# Patient Record
Sex: Female | Born: 1935 | Race: Black or African American | Hispanic: No | Marital: Married | Smoking: Former smoker
Health system: Southern US, Community
[De-identification: ages and names within clinical notes are randomized; demographics above are authoritative.]

## PROBLEM LIST (undated history)

## (undated) DIAGNOSIS — D649 Anemia, unspecified: Secondary | ICD-10-CM

## (undated) DIAGNOSIS — E669 Obesity, unspecified: Secondary | ICD-10-CM

## (undated) DIAGNOSIS — T7840XA Allergy, unspecified, initial encounter: Secondary | ICD-10-CM

## (undated) DIAGNOSIS — G4730 Sleep apnea, unspecified: Secondary | ICD-10-CM

## (undated) DIAGNOSIS — E785 Hyperlipidemia, unspecified: Secondary | ICD-10-CM

## (undated) DIAGNOSIS — M549 Dorsalgia, unspecified: Secondary | ICD-10-CM

## (undated) DIAGNOSIS — M1990 Unspecified osteoarthritis, unspecified site: Secondary | ICD-10-CM

## (undated) DIAGNOSIS — I5189 Other ill-defined heart diseases: Secondary | ICD-10-CM

## (undated) DIAGNOSIS — N189 Chronic kidney disease, unspecified: Secondary | ICD-10-CM

## (undated) DIAGNOSIS — E109 Type 1 diabetes mellitus without complications: Secondary | ICD-10-CM

## (undated) DIAGNOSIS — I10 Essential (primary) hypertension: Secondary | ICD-10-CM

## (undated) HISTORY — PX: OTHER SURGICAL HISTORY: SHX169

## (undated) HISTORY — DX: Unspecified osteoarthritis, unspecified site: M19.90

## (undated) HISTORY — DX: Obesity, unspecified: E66.9

## (undated) HISTORY — DX: Other ill-defined heart diseases: I51.89

## (undated) HISTORY — DX: Sleep apnea, unspecified: G47.30

## (undated) HISTORY — DX: Anemia, unspecified: D64.9

## (undated) HISTORY — DX: Chronic kidney disease, unspecified: N18.9

## (undated) HISTORY — DX: Essential (primary) hypertension: I10

## (undated) HISTORY — DX: Allergy, unspecified, initial encounter: T78.40XA

## (undated) HISTORY — DX: Hyperlipidemia, unspecified: E78.5

## (undated) HISTORY — DX: Dorsalgia, unspecified: M54.9

## (undated) HISTORY — DX: Type 1 diabetes mellitus without complications: E10.9

---

## 1974-03-24 HISTORY — PX: OTHER SURGICAL HISTORY: SHX169

## 1976-03-24 HISTORY — PX: MYOMECTOMY: SHX85

## 1976-03-24 HISTORY — PX: VAGINAL HYSTERECTOMY: SHX2639

## 2002-08-09 ENCOUNTER — Encounter: Payer: Self-pay | Admitting: Emergency Medicine

## 2002-08-09 ENCOUNTER — Emergency Department (HOSPITAL_COMMUNITY): Admission: EM | Admit: 2002-08-09 | Discharge: 2002-08-09 | Payer: Self-pay | Admitting: Emergency Medicine

## 2003-02-02 ENCOUNTER — Ambulatory Visit (HOSPITAL_COMMUNITY): Admission: RE | Admit: 2003-02-02 | Discharge: 2003-02-02 | Payer: Self-pay | Admitting: Family Medicine

## 2003-04-05 ENCOUNTER — Inpatient Hospital Stay (HOSPITAL_COMMUNITY): Admission: RE | Admit: 2003-04-05 | Discharge: 2003-04-09 | Payer: Self-pay | Admitting: Orthopedic Surgery

## 2003-07-28 ENCOUNTER — Emergency Department (HOSPITAL_COMMUNITY): Admission: EM | Admit: 2003-07-28 | Discharge: 2003-07-29 | Payer: Self-pay | Admitting: *Deleted

## 2004-01-25 ENCOUNTER — Ambulatory Visit: Payer: Self-pay | Admitting: Family Medicine

## 2004-02-21 ENCOUNTER — Ambulatory Visit (HOSPITAL_COMMUNITY): Admission: RE | Admit: 2004-02-21 | Discharge: 2004-02-21 | Payer: Self-pay | Admitting: Family Medicine

## 2004-05-01 ENCOUNTER — Ambulatory Visit: Payer: Self-pay | Admitting: Family Medicine

## 2004-07-02 ENCOUNTER — Ambulatory Visit: Payer: Self-pay | Admitting: Family Medicine

## 2004-07-03 ENCOUNTER — Ambulatory Visit (HOSPITAL_COMMUNITY): Admission: RE | Admit: 2004-07-03 | Discharge: 2004-07-03 | Payer: Self-pay | Admitting: Family Medicine

## 2004-10-16 ENCOUNTER — Ambulatory Visit: Payer: Self-pay | Admitting: Family Medicine

## 2004-12-30 ENCOUNTER — Ambulatory Visit: Payer: Self-pay | Admitting: Family Medicine

## 2005-02-12 ENCOUNTER — Ambulatory Visit: Payer: Self-pay | Admitting: Family Medicine

## 2005-02-14 ENCOUNTER — Encounter (INDEPENDENT_AMBULATORY_CARE_PROVIDER_SITE_OTHER): Payer: Self-pay | Admitting: *Deleted

## 2005-02-27 ENCOUNTER — Ambulatory Visit (HOSPITAL_COMMUNITY): Admission: RE | Admit: 2005-02-27 | Discharge: 2005-02-27 | Payer: Self-pay | Admitting: Family Medicine

## 2005-05-19 ENCOUNTER — Ambulatory Visit: Payer: Self-pay | Admitting: Family Medicine

## 2005-06-03 ENCOUNTER — Ambulatory Visit (HOSPITAL_COMMUNITY): Admission: RE | Admit: 2005-06-03 | Discharge: 2005-06-03 | Payer: Self-pay | Admitting: Family Medicine

## 2005-11-05 ENCOUNTER — Ambulatory Visit: Payer: Self-pay | Admitting: Family Medicine

## 2005-12-24 ENCOUNTER — Ambulatory Visit: Payer: Self-pay | Admitting: Family Medicine

## 2005-12-31 ENCOUNTER — Ambulatory Visit (HOSPITAL_COMMUNITY): Admission: RE | Admit: 2005-12-31 | Discharge: 2005-12-31 | Payer: Self-pay | Admitting: Family Medicine

## 2006-01-28 ENCOUNTER — Encounter: Admission: RE | Admit: 2006-01-28 | Discharge: 2006-01-28 | Payer: Self-pay | Admitting: Family Medicine

## 2006-04-01 ENCOUNTER — Encounter: Admission: RE | Admit: 2006-04-01 | Discharge: 2006-04-01 | Payer: Self-pay | Admitting: Family Medicine

## 2006-04-22 ENCOUNTER — Encounter: Admission: RE | Admit: 2006-04-22 | Discharge: 2006-04-22 | Payer: Self-pay | Admitting: Family Medicine

## 2006-05-20 ENCOUNTER — Ambulatory Visit: Payer: Self-pay | Admitting: Family Medicine

## 2006-05-21 ENCOUNTER — Encounter: Payer: Self-pay | Admitting: Family Medicine

## 2006-05-28 ENCOUNTER — Encounter: Payer: Self-pay | Admitting: Family Medicine

## 2006-05-28 LAB — CONVERTED CEMR LAB
Alkaline Phosphatase: 97 units/L (ref 39–117)
BUN: 22 mg/dL (ref 6–23)
Basophils Relative: 1 % (ref 0–1)
Bilirubin, Direct: 0.1 mg/dL (ref 0.0–0.3)
Chloride: 110 meq/L (ref 96–112)
Cholesterol: 133 mg/dL (ref 0–200)
Creatinine, Ser: 1.39 mg/dL — ABNORMAL HIGH (ref 0.40–1.20)
Eosinophils Absolute: 0.3 10*3/uL (ref 0.0–0.7)
Glucose, Bld: 87 mg/dL (ref 70–99)
Hemoglobin: 10.2 g/dL — ABNORMAL LOW (ref 12.0–15.0)
Hgb A1c MFr Bld: 6.9 % — ABNORMAL HIGH (ref 4.6–6.1)
Indirect Bilirubin: 0.2 mg/dL (ref 0.0–0.9)
LDL Cholesterol: 65 mg/dL (ref 0–99)
Lymphs Abs: 1.6 10*3/uL (ref 0.7–3.3)
MCHC: 30.6 g/dL (ref 30.0–36.0)
MCV: 83.3 fL (ref 78.0–100.0)
Monocytes Absolute: 1 10*3/uL — ABNORMAL HIGH (ref 0.2–0.7)
Monocytes Relative: 13 % — ABNORMAL HIGH (ref 3–11)
RBC: 4 M/uL (ref 3.87–5.11)
Total Protein: 7.1 g/dL (ref 6.0–8.3)
Triglycerides: 78 mg/dL (ref <150)

## 2006-06-03 ENCOUNTER — Ambulatory Visit: Payer: Self-pay | Admitting: Family Medicine

## 2006-06-30 ENCOUNTER — Ambulatory Visit: Payer: Self-pay | Admitting: Family Medicine

## 2006-08-13 ENCOUNTER — Ambulatory Visit: Payer: Self-pay | Admitting: Family Medicine

## 2006-09-23 ENCOUNTER — Ambulatory Visit: Payer: Self-pay | Admitting: Family Medicine

## 2006-09-30 ENCOUNTER — Ambulatory Visit (HOSPITAL_COMMUNITY): Admission: RE | Admit: 2006-09-30 | Discharge: 2006-09-30 | Payer: Self-pay | Admitting: Family Medicine

## 2006-12-09 ENCOUNTER — Ambulatory Visit: Payer: Self-pay | Admitting: Family Medicine

## 2007-03-03 ENCOUNTER — Ambulatory Visit: Payer: Self-pay | Admitting: Family Medicine

## 2007-03-25 ENCOUNTER — Encounter: Payer: Self-pay | Admitting: Family Medicine

## 2007-03-25 DIAGNOSIS — G4730 Sleep apnea, unspecified: Secondary | ICD-10-CM

## 2007-03-25 HISTORY — DX: Sleep apnea, unspecified: G47.30

## 2007-04-03 ENCOUNTER — Encounter: Payer: Self-pay | Admitting: Family Medicine

## 2007-04-03 LAB — CONVERTED CEMR LAB
ALT: 10 units/L (ref 0–35)
AST: 12 units/L (ref 0–37)
BUN: 27 mg/dL — ABNORMAL HIGH (ref 6–23)
Bilirubin, Direct: 0.1 mg/dL (ref 0.0–0.3)
CO2: 22 meq/L (ref 19–32)
Calcium: 9.2 mg/dL (ref 8.4–10.5)
Cholesterol: 134 mg/dL (ref 0–200)
Creatinine, Ser: 1.57 mg/dL — ABNORMAL HIGH (ref 0.40–1.20)
Eosinophils Absolute: 0.4 10*3/uL (ref 0.0–0.7)
Eosinophils Relative: 5 % (ref 0–5)
Glucose, Bld: 114 mg/dL — ABNORMAL HIGH (ref 70–99)
HCT: 33.2 % — ABNORMAL LOW (ref 36.0–46.0)
Hemoglobin: 10.1 g/dL — ABNORMAL LOW (ref 12.0–15.0)
Indirect Bilirubin: 0.1 mg/dL (ref 0.0–0.9)
Lymphocytes Relative: 34 % (ref 12–46)
Lymphs Abs: 3 10*3/uL (ref 0.7–4.0)
MCV: 80.6 fL (ref 78.0–100.0)
Microalb, Ur: 30.2 mg/dL — ABNORMAL HIGH (ref 0.00–1.89)
Monocytes Absolute: 0.8 10*3/uL (ref 0.1–1.0)
Monocytes Relative: 8 % (ref 3–12)
Platelets: 313 10*3/uL (ref 150–400)
Total Bilirubin: 0.2 mg/dL — ABNORMAL LOW (ref 0.3–1.2)
Total CHOL/HDL Ratio: 3.6
WBC: 8.9 10*3/uL (ref 4.0–10.5)

## 2007-04-21 ENCOUNTER — Other Ambulatory Visit: Admission: RE | Admit: 2007-04-21 | Discharge: 2007-04-21 | Payer: Self-pay | Admitting: Family Medicine

## 2007-04-21 ENCOUNTER — Encounter (INDEPENDENT_AMBULATORY_CARE_PROVIDER_SITE_OTHER): Payer: Self-pay | Admitting: *Deleted

## 2007-04-21 ENCOUNTER — Ambulatory Visit: Payer: Self-pay | Admitting: Family Medicine

## 2007-04-21 ENCOUNTER — Encounter: Payer: Self-pay | Admitting: Family Medicine

## 2007-05-18 ENCOUNTER — Ambulatory Visit: Payer: Self-pay | Admitting: Family Medicine

## 2007-06-23 ENCOUNTER — Ambulatory Visit: Payer: Self-pay | Admitting: Family Medicine

## 2007-07-28 ENCOUNTER — Ambulatory Visit: Payer: Self-pay | Admitting: Family Medicine

## 2007-08-04 ENCOUNTER — Encounter (INDEPENDENT_AMBULATORY_CARE_PROVIDER_SITE_OTHER): Payer: Self-pay | Admitting: *Deleted

## 2007-08-04 DIAGNOSIS — M549 Dorsalgia, unspecified: Secondary | ICD-10-CM | POA: Insufficient documentation

## 2007-08-04 DIAGNOSIS — E669 Obesity, unspecified: Secondary | ICD-10-CM

## 2007-08-04 DIAGNOSIS — E109 Type 1 diabetes mellitus without complications: Secondary | ICD-10-CM | POA: Insufficient documentation

## 2007-08-04 DIAGNOSIS — I10 Essential (primary) hypertension: Secondary | ICD-10-CM | POA: Insufficient documentation

## 2007-08-04 DIAGNOSIS — E785 Hyperlipidemia, unspecified: Secondary | ICD-10-CM

## 2007-10-06 ENCOUNTER — Ambulatory Visit: Payer: Self-pay | Admitting: Family Medicine

## 2007-10-25 ENCOUNTER — Telehealth: Payer: Self-pay | Admitting: Family Medicine

## 2007-11-16 ENCOUNTER — Ambulatory Visit: Payer: Self-pay | Admitting: Family Medicine

## 2007-11-16 DIAGNOSIS — M25559 Pain in unspecified hip: Secondary | ICD-10-CM

## 2007-11-16 DIAGNOSIS — R7302 Impaired glucose tolerance (oral): Secondary | ICD-10-CM | POA: Insufficient documentation

## 2007-11-16 LAB — CONVERTED CEMR LAB
Glucose, Bld: 140 mg/dL
Hgb A1c MFr Bld: 6.7 %

## 2007-11-17 ENCOUNTER — Telehealth: Payer: Self-pay | Admitting: Family Medicine

## 2007-11-25 ENCOUNTER — Encounter: Payer: Self-pay | Admitting: Family Medicine

## 2007-11-26 ENCOUNTER — Ambulatory Visit (HOSPITAL_COMMUNITY): Admission: RE | Admit: 2007-11-26 | Discharge: 2007-11-26 | Payer: Self-pay | Admitting: Family Medicine

## 2007-12-01 ENCOUNTER — Telehealth: Payer: Self-pay | Admitting: Family Medicine

## 2007-12-03 ENCOUNTER — Encounter: Payer: Self-pay | Admitting: Family Medicine

## 2007-12-17 ENCOUNTER — Encounter (HOSPITAL_COMMUNITY): Admission: RE | Admit: 2007-12-17 | Discharge: 2007-12-22 | Payer: Self-pay | Admitting: Unknown Physician Specialty

## 2007-12-22 ENCOUNTER — Ambulatory Visit: Payer: Self-pay | Admitting: Family Medicine

## 2007-12-24 ENCOUNTER — Encounter (HOSPITAL_COMMUNITY): Admission: RE | Admit: 2007-12-24 | Discharge: 2008-01-23 | Payer: Self-pay | Admitting: Unknown Physician Specialty

## 2008-01-04 ENCOUNTER — Encounter: Payer: Self-pay | Admitting: Family Medicine

## 2008-01-19 ENCOUNTER — Ambulatory Visit: Payer: Self-pay | Admitting: Family Medicine

## 2008-02-01 ENCOUNTER — Encounter: Payer: Self-pay | Admitting: Family Medicine

## 2008-02-23 ENCOUNTER — Ambulatory Visit: Payer: Self-pay | Admitting: Family Medicine

## 2008-02-23 DIAGNOSIS — R5381 Other malaise: Secondary | ICD-10-CM

## 2008-02-23 DIAGNOSIS — R5383 Other fatigue: Secondary | ICD-10-CM

## 2008-02-23 LAB — CONVERTED CEMR LAB: Glucose, Bld: 128 mg/dL

## 2008-02-24 ENCOUNTER — Encounter: Payer: Self-pay | Admitting: Family Medicine

## 2008-03-02 ENCOUNTER — Encounter: Payer: Self-pay | Admitting: Family Medicine

## 2008-04-26 ENCOUNTER — Telehealth: Payer: Self-pay | Admitting: Family Medicine

## 2008-04-26 ENCOUNTER — Ambulatory Visit: Payer: Self-pay | Admitting: Family Medicine

## 2008-05-08 ENCOUNTER — Encounter: Payer: Self-pay | Admitting: Family Medicine

## 2008-05-17 ENCOUNTER — Ambulatory Visit: Payer: Self-pay | Admitting: Family Medicine

## 2008-06-01 ENCOUNTER — Encounter: Payer: Self-pay | Admitting: Family Medicine

## 2008-07-06 ENCOUNTER — Encounter: Payer: Self-pay | Admitting: Family Medicine

## 2008-07-31 ENCOUNTER — Ambulatory Visit: Payer: Self-pay | Admitting: Family Medicine

## 2008-07-31 DIAGNOSIS — M170 Bilateral primary osteoarthritis of knee: Secondary | ICD-10-CM

## 2008-07-31 LAB — CONVERTED CEMR LAB
Glucose, Bld: 115 mg/dL
Hgb A1c MFr Bld: 7.2 %

## 2008-08-01 DIAGNOSIS — R946 Abnormal results of thyroid function studies: Secondary | ICD-10-CM

## 2008-08-01 LAB — CONVERTED CEMR LAB
BUN: 20 mg/dL (ref 6–23)
Basophils Absolute: 0.1 10*3/uL (ref 0.0–0.1)
Basophils Relative: 1 % (ref 0–1)
Calcium: 9.5 mg/dL (ref 8.4–10.5)
Eosinophils Relative: 3 % (ref 0–5)
Glucose, Bld: 86 mg/dL (ref 70–99)
HCT: 29.8 % — ABNORMAL LOW (ref 36.0–46.0)
Hemoglobin: 9.4 g/dL — ABNORMAL LOW (ref 12.0–15.0)
MCHC: 31.5 g/dL (ref 30.0–36.0)
Monocytes Absolute: 0.9 10*3/uL (ref 0.1–1.0)
Neutro Abs: 4.9 10*3/uL (ref 1.7–7.7)
Platelets: 353 10*3/uL (ref 150–400)
RDW: 17.5 % — ABNORMAL HIGH (ref 11.5–15.5)
TSH: 12.974 microintl units/mL — ABNORMAL HIGH (ref 0.350–4.500)

## 2008-08-03 ENCOUNTER — Ambulatory Visit (HOSPITAL_COMMUNITY): Admission: RE | Admit: 2008-08-03 | Discharge: 2008-08-03 | Payer: Self-pay | Admitting: Family Medicine

## 2008-10-02 ENCOUNTER — Encounter: Payer: Self-pay | Admitting: Family Medicine

## 2008-10-03 ENCOUNTER — Telehealth: Payer: Self-pay | Admitting: Family Medicine

## 2008-10-03 ENCOUNTER — Ambulatory Visit: Payer: Self-pay | Admitting: Family Medicine

## 2008-11-24 ENCOUNTER — Telehealth: Payer: Self-pay | Admitting: Family Medicine

## 2008-11-28 ENCOUNTER — Encounter: Payer: Self-pay | Admitting: Family Medicine

## 2008-12-01 ENCOUNTER — Encounter: Payer: Self-pay | Admitting: Family Medicine

## 2008-12-26 ENCOUNTER — Ambulatory Visit: Payer: Self-pay | Admitting: Family Medicine

## 2009-01-22 ENCOUNTER — Encounter: Payer: Self-pay | Admitting: Family Medicine

## 2009-02-05 ENCOUNTER — Encounter: Payer: Self-pay | Admitting: Family Medicine

## 2009-02-26 ENCOUNTER — Telehealth: Payer: Self-pay | Admitting: Family Medicine

## 2009-03-01 ENCOUNTER — Ambulatory Visit: Payer: Self-pay | Admitting: Family Medicine

## 2009-03-01 LAB — CONVERTED CEMR LAB: Hgb A1c MFr Bld: 6.4 %

## 2009-03-02 LAB — CONVERTED CEMR LAB
ALT: 9 units/L (ref 0–35)
AST: 16 units/L (ref 0–37)
Alkaline Phosphatase: 118 units/L — ABNORMAL HIGH (ref 39–117)
Basophils Absolute: 0.1 10*3/uL (ref 0.0–0.1)
Basophils Relative: 1 % (ref 0–1)
Bilirubin, Direct: <0.1 mg/dL (ref 0.0–0.3)
CO2: 16 meq/L — ABNORMAL LOW (ref 19–32)
Calcium: 9.6 mg/dL (ref 8.4–10.5)
Cholesterol: 144 mg/dL (ref 0–200)
Creatinine, Ser: 1.74 mg/dL — ABNORMAL HIGH (ref 0.40–1.20)
Eosinophils Absolute: 0.4 10*3/uL (ref 0.0–0.7)
Glucose, Bld: 88 mg/dL (ref 70–99)
MCHC: 32.1 g/dL (ref 30.0–36.0)
MCV: 78.8 fL (ref 78.0–100.0)
Monocytes Relative: 9 % (ref 3–12)
Neutro Abs: 4.4 10*3/uL (ref 1.7–7.7)
Neutrophils Relative %: 46 % (ref 43–77)
RBC: 4.15 M/uL (ref 3.87–5.11)
RDW: 17.2 % — ABNORMAL HIGH (ref 11.5–15.5)
Total Bilirubin: 0.2 mg/dL — ABNORMAL LOW (ref 0.3–1.2)

## 2009-03-05 LAB — CONVERTED CEMR LAB: Microalb Creat Ratio: 509.6 mg/g — ABNORMAL HIGH (ref 0.0–30.0)

## 2009-03-13 ENCOUNTER — Telehealth: Payer: Self-pay | Admitting: Family Medicine

## 2009-03-20 ENCOUNTER — Telehealth: Payer: Self-pay | Admitting: Family Medicine

## 2009-03-29 ENCOUNTER — Ambulatory Visit (HOSPITAL_COMMUNITY): Admission: RE | Admit: 2009-03-29 | Discharge: 2009-03-29 | Payer: Self-pay | Admitting: Family Medicine

## 2009-03-30 ENCOUNTER — Encounter: Payer: Self-pay | Admitting: Family Medicine

## 2009-04-04 ENCOUNTER — Encounter: Payer: Self-pay | Admitting: Family Medicine

## 2009-04-05 ENCOUNTER — Telehealth: Payer: Self-pay | Admitting: Family Medicine

## 2009-05-25 ENCOUNTER — Encounter: Payer: Self-pay | Admitting: Family Medicine

## 2009-07-05 ENCOUNTER — Ambulatory Visit: Payer: Self-pay | Admitting: Family Medicine

## 2009-07-05 LAB — CONVERTED CEMR LAB: Blood Glucose, Fasting: 108 mg/dL

## 2009-07-06 ENCOUNTER — Telehealth: Payer: Self-pay | Admitting: Family Medicine

## 2009-07-10 ENCOUNTER — Ambulatory Visit (HOSPITAL_COMMUNITY): Admission: RE | Admit: 2009-07-10 | Discharge: 2009-07-10 | Payer: Self-pay | Admitting: Family Medicine

## 2009-07-10 LAB — CONVERTED CEMR LAB
Albumin: 4.5 g/dL (ref 3.5–5.2)
Alkaline Phosphatase: 117 units/L (ref 39–117)
Chloride: 106 meq/L (ref 96–112)
HDL: 40 mg/dL (ref >39)
Hgb A1c MFr Bld: 6.6 % — ABNORMAL HIGH (ref <5.7)
LDL Cholesterol: 57 mg/dL (ref 0–99)
Potassium: 3.9 meq/L (ref 3.5–5.3)
Sodium: 139 meq/L (ref 135–145)
Total CHOL/HDL Ratio: 4
Total Protein: 7.8 g/dL (ref 6.0–8.3)
Triglycerides: 312 mg/dL — ABNORMAL HIGH (ref <150)
VLDL: 62 mg/dL — ABNORMAL HIGH (ref 0–40)
Vit D, 25-Hydroxy: 10 ng/mL — ABNORMAL LOW (ref 30–89)

## 2009-07-20 ENCOUNTER — Encounter: Payer: Self-pay | Admitting: Family Medicine

## 2009-08-17 ENCOUNTER — Encounter: Payer: Self-pay | Admitting: Family Medicine

## 2009-10-01 ENCOUNTER — Telehealth: Payer: Self-pay | Admitting: Family Medicine

## 2009-10-02 ENCOUNTER — Ambulatory Visit: Payer: Self-pay | Admitting: Family Medicine

## 2009-11-13 ENCOUNTER — Ambulatory Visit: Payer: Self-pay | Admitting: Family Medicine

## 2009-11-13 DIAGNOSIS — M949 Disorder of cartilage, unspecified: Secondary | ICD-10-CM

## 2009-11-13 DIAGNOSIS — N189 Chronic kidney disease, unspecified: Secondary | ICD-10-CM

## 2009-11-13 DIAGNOSIS — M899 Disorder of bone, unspecified: Secondary | ICD-10-CM | POA: Insufficient documentation

## 2009-11-19 ENCOUNTER — Encounter: Payer: Self-pay | Admitting: Family Medicine

## 2009-11-19 LAB — CONVERTED CEMR LAB
Eosinophils Absolute: 0.2 10*3/uL (ref 0.0–0.7)
Hgb A1c MFr Bld: 6.8 % — ABNORMAL HIGH (ref <5.7)
Iron: 49 ug/dL (ref 42–145)
Lymphs Abs: 2.8 10*3/uL (ref 0.7–4.0)
MCV: 81.6 fL (ref 78.0–100.0)
Neutrophils Relative %: 58 % (ref 43–77)
Platelets: 341 10*3/uL (ref 150–400)
TSH: 6.848 microintl units/mL — ABNORMAL HIGH (ref 0.350–4.500)
WBC: 8.9 10*3/uL (ref 4.0–10.5)

## 2009-11-20 ENCOUNTER — Encounter: Payer: Self-pay | Admitting: Family Medicine

## 2009-11-29 ENCOUNTER — Ambulatory Visit: Payer: Self-pay | Admitting: Family Medicine

## 2010-01-22 DIAGNOSIS — I5189 Other ill-defined heart diseases: Secondary | ICD-10-CM

## 2010-01-22 HISTORY — DX: Other ill-defined heart diseases: I51.89

## 2010-02-04 ENCOUNTER — Telehealth: Payer: Self-pay | Admitting: Family Medicine

## 2010-02-04 ENCOUNTER — Encounter: Payer: Self-pay | Admitting: Family Medicine

## 2010-02-04 ENCOUNTER — Observation Stay (HOSPITAL_COMMUNITY)
Admission: EM | Admit: 2010-02-04 | Discharge: 2010-02-06 | Payer: Self-pay | Source: Home / Self Care | Admitting: Emergency Medicine

## 2010-02-05 ENCOUNTER — Encounter (INDEPENDENT_AMBULATORY_CARE_PROVIDER_SITE_OTHER): Payer: Self-pay | Admitting: Internal Medicine

## 2010-02-05 ENCOUNTER — Telehealth: Payer: Self-pay | Admitting: Family Medicine

## 2010-02-06 ENCOUNTER — Encounter: Payer: Self-pay | Admitting: Family Medicine

## 2010-02-12 ENCOUNTER — Telehealth: Payer: Self-pay | Admitting: Family Medicine

## 2010-02-12 ENCOUNTER — Ambulatory Visit: Payer: Self-pay | Admitting: Family Medicine

## 2010-02-12 DIAGNOSIS — N186 End stage renal disease: Secondary | ICD-10-CM | POA: Insufficient documentation

## 2010-02-13 ENCOUNTER — Telehealth: Payer: Self-pay | Admitting: Family Medicine

## 2010-02-13 ENCOUNTER — Encounter: Payer: Self-pay | Admitting: Family Medicine

## 2010-02-18 ENCOUNTER — Encounter: Payer: Self-pay | Admitting: Family Medicine

## 2010-02-25 ENCOUNTER — Telehealth: Payer: Self-pay | Admitting: Family Medicine

## 2010-03-01 ENCOUNTER — Encounter: Payer: Self-pay | Admitting: Family Medicine

## 2010-03-04 ENCOUNTER — Telehealth: Payer: Self-pay | Admitting: Family Medicine

## 2010-03-04 ENCOUNTER — Encounter: Payer: Self-pay | Admitting: Family Medicine

## 2010-03-05 ENCOUNTER — Telehealth: Payer: Self-pay | Admitting: Family Medicine

## 2010-03-11 ENCOUNTER — Encounter: Payer: Self-pay | Admitting: Family Medicine

## 2010-03-11 ENCOUNTER — Telehealth: Payer: Self-pay | Admitting: Family Medicine

## 2010-03-12 ENCOUNTER — Telehealth: Payer: Self-pay | Admitting: Family Medicine

## 2010-03-13 ENCOUNTER — Encounter: Payer: Self-pay | Admitting: Family Medicine

## 2010-03-14 ENCOUNTER — Ambulatory Visit: Payer: Self-pay | Admitting: Family Medicine

## 2010-04-14 ENCOUNTER — Encounter: Payer: Self-pay | Admitting: Family Medicine

## 2010-04-15 ENCOUNTER — Encounter: Payer: Self-pay | Admitting: Family Medicine

## 2010-04-19 ENCOUNTER — Encounter: Payer: Self-pay | Admitting: Family Medicine

## 2010-04-23 NOTE — Assessment & Plan Note (Signed)
Summary: INJECTION  Nurse Visit   Allergies: 1)  ! Ace Inhibitors 2)  ! * Arb  Medication Administration  Injection # 1:    Medication: Depo- Medrol 80mg     Diagnosis: KNEE PAIN, BILATERAL (ICD-719.46)    Route: IM    Site: LUOQ gluteus    Exp Date: 4/12    Lot #: [id number]    Mfr: Pharmacia    Patient tolerated injection without complications    Given by: Adella Hare LPN (October 02, 2009 4:33 PM)  Orders Added: 1)  Depo- Medrol 80mg  [J1040] 2)  Admin of Therapeutic Inj  intramuscular or subcutaneous [[id number]]   Medication Administration  Injection # 1:    Medication: Depo- Medrol 80mg     Diagnosis: KNEE PAIN, BILATERAL (ICD-719.46)    Route: IM    Site: LUOQ gluteus    Exp Date: 4/12    Lot #: [id number]    Mfr: Pharmacia    Patient tolerated injection without complications    Given by: Adella Hare LPN (October 02, 2009 4:33 PM)  Orders Added: 1)  Depo- Medrol 80mg  [J1040] 2)  Admin of Therapeutic Inj  intramuscular or subcutaneous [[id number]]

## 2010-04-23 NOTE — Assessment & Plan Note (Signed)
Summary: office visit   Vital Signs:  Patient profile:   75 year old female Menstrual status:  hysterectomy Height:      60 inches Weight:      253.50 pounds BMI:     49.69 O2 Sat:      95 % Pulse rate:   78 / minute Pulse rhythm:   regular Resp:     16 per minute BP sitting:   140 / 78  (left arm) Cuff size:   xl  Vitals Entered By: Everitt Amber LPN (November 13, 2009 2:29 PM)  Nutrition Counseling: Patient's BMI is greater than 25 and therefore counseled on weight management options. CC: Still having trouble with legs and knee pain    CC:  Still having trouble with legs and knee pain .  History of Present Illness: Reports  that she has been doing fairly well. Denies recent fever or chills. Denies sinus pressure, nasal congestion , ear pain or sore throat. Denies chest congestion, or cough productive of sputum. Denies chest pain, palpitations, PND, orthopnea or leg swelling. Denies abdominal pain, nausea, vomitting, diarrhea or constipation. Denies change in bowel movements or bloody stool. Denies dysuria , frequency, incontinence or hesitancy.  Denies headaches, vertigo, seizures. Denies depression, anxiety or insomnia. Denies  rash, lesions, or itch.     Current Medications (verified): 1)  Flexeril 5 Mg  Tabs (Cyclobenzaprine Hcl) .... One Tab By Mouth At Bedtime 2)  Klor-Con M20 20 Meq  Tbcr (Potassium Chloride Crys Cr) .... One Tab By Mouth Once Daily 3)  Hydralazine Hcl 10 Mg  Tabs (Hydralazine Hcl) .... One Tab By Mouth Three Times A Day 4)  Omeprazole 20 Mg  Cpdr (Omeprazole) .... One Cap By Mouth Two Times A Day 5)  Clonidine Hcl 0.3 Mg  Tabs (Clonidine Hcl) .... One Tab By Mouth Three Times A Day 6)  Glipizide Xl 2.5 Mg  Tb24 (Glipizide) .... One Tab By Mouth Once Daily 7)  Lovastatin 40 Mg  Tabs (Lovastatin) .... Two Tabs By Mouth At Bedtime 8)  Aspirin 81 Mg  Tbec (Aspirin) .... One Tab By Mouth Once Daily 9)  Metoprolol Tartrate 50 Mg  Tabs (Metoprolol  Tartrate) .... One Tab By Mouth Two Times A Day 10)  Triamcinolone Acetonide 0.5 %  Crea (Triamcinolone Acetonide) .... Apply Topically Twice Daily As Needed 11)  Vicodin Es 7.5-750 Mg Tabs (Hydrocodone-Acetaminophen) .... Take 1 Tablet By Mouth Two Times A Day As Needed 12)  Amlodipine Besylate 10 Mg Tabs (Amlodipine Besylate) .... Take 1 Tablet By Mouth Once A Day 13)  Maxzide 75-50 Mg Tabs (Triamterene-Hctz) .... Take 1 Tablet By Mouth Once A Day 14)  Vitamin D (Ergocalciferol) 50000 Unit Caps (Ergocalciferol) .... One Tab Once A Week  Allergies (verified): 1)  ! Ace Inhibitors 2)  ! * Arb  Review of Systems      See HPI General:  Complains of fatigue and sleep disorder. Eyes:  Denies discharge, eye pain, and red eye. MS:  Complains of joint pain, low back pain, mid back pain, and stiffness; increased knee pain and instability as well as chronic back pain. Endo:  Denies excessive thirst and heat intolerance; tests sugars on avg 2 to 3 tiomes per week, fasting sugars seldom over 120. Heme:  Denies abnormal bruising and bleeding. Allergy:  Denies hives or rash and itching eyes.  Physical Exam  General:  Well-developed,obese,in no acute distress; alert,appropriate and cooperative throughout examination HEENT: No facial asymmetry,  EOMI, No sinus  tenderness, TM's Clear, oropharynx  pink and moist.   Chest: Clear to auscultation bilaterally.  CVS: S1, S2, No murmurs, No S3.   Abd: Soft, Nontender.  MS: decreased ROM spine, hips,  and knees.  Ext: No edema.   CNS: CN 2-12 intact, power tone and sensation normal throughout.   Skin: Intact, no visible lesions or rashes.  Psych: Good eye contact, normal affect.  Memory intact, not anxious or depressed appearing.    Impression & Recommendations:  Problem # 1:  ANEMIA (ICD-285.9) Assessment Comment Only  Orders: T-CBC w/Diff ([id number]) T-Iron [id number]) T-Ferritin [phone])  Hgb: 10.5 (03/01/2009)   Hct: 32.7  (03/01/2009)   Platelets: 371 (03/01/2009) RBC: 4.15 (03/01/2009)   RDW: 17.2 (03/01/2009)   WBC: 9.5 (03/01/2009) MCV: 78.8 (03/01/2009)   MCHC: 32.1 (03/01/2009) TSH: 14.311 (03/01/2009)  Problem # 2:  CHRONIC KIDNEY DISEASE UNSPECIFIED (ICD-585.9) Assessment: Deteriorated  Orders: Nephrology Referral (Nephro), increased microalbuminemia with anemia, pt may need aranesp  Labs Reviewed: BUN: 24 (07/05/2009)   Cr: 1.67 (07/05/2009)    Hgb: 10.5 (03/01/2009)   Hct: 32.7 (03/01/2009)   Ca++: 9.7 (07/05/2009)    TP: 7.8 (07/05/2009)   Alb: 4.5 (07/05/2009)  Problem # 3:  KNEE PAIN, BILATERAL (ICD-719.46) Assessment: Deteriorated  Her updated medication list for this problem includes:    Flexeril 5 Mg Tabs (Cyclobenzaprine hcl) ..... One tab by mouth at bedtime    Aspirin 81 Mg Tbec (Aspirin) ..... One tab by mouth once daily    Vicodin Es 7.5-750 Mg Tabs (Hydrocodone-acetaminophen) .Marland Kitchen... Take 1 tablet by mouth two times a day as needed  Problem # 4:  DIABETES MELLITUS, TYPE I (ICD-250.01) Assessment: Comment Only  Her updated medication list for this problem includes:    Glipizide Xl 2.5 Mg Tb24 (Glipizide) ..... One tab by mouth once daily    Aspirin 81 Mg Tbec (Aspirin) ..... One tab by mouth once daily  Labs Reviewed: Creat: 1.67 (07/05/2009)    Reviewed HgBA1c results: 6.6 (07/05/2009)  6.4 (03/01/2009)  Problem # 5:  HYPERTENSION (ICD-401.9) Assessment: Unchanged  Her updated medication list for this problem includes:    Hydralazine Hcl 10 Mg Tabs (Hydralazine hcl) ..... One tab by mouth three times a day    Clonidine Hcl 0.3 Mg Tabs (Clonidine hcl) ..... One tab by mouth three times a day    Metoprolol Tartrate 50 Mg Tabs (Metoprolol tartrate) ..... One tab by mouth two times a day    Amlodipine Besylate 10 Mg Tabs (Amlodipine besylate) .Marland Kitchen... Take 1 tablet by mouth once a day    Maxzide 75-50 Mg Tabs (Triamterene-hctz) .Marland Kitchen... Take 1 tablet by mouth once a day  BP  today: 140/78, suboptoimal control esp in light of CKD and anemia Prior BP: 148/80 (07/05/2009)  Labs Reviewed: K+: 3.9 (07/05/2009) Creat: : 1.67 (07/05/2009)   Chol: 159 (07/05/2009)   HDL: 40 (07/05/2009)   LDL: 57 (07/05/2009)   TG: 312 (07/05/2009)  Complete Medication List: 1)  Flexeril 5 Mg Tabs (Cyclobenzaprine hcl) .... One tab by mouth at bedtime 2)  Klor-con M20 20 Meq Tbcr (Potassium chloride crys cr) .... One tab by mouth once daily 3)  Hydralazine Hcl 10 Mg Tabs (Hydralazine hcl) .... One tab by mouth three times a day 4)  Omeprazole 20 Mg Cpdr (Omeprazole) .... One cap by mouth two times a day 5)  Clonidine Hcl 0.3 Mg Tabs (Clonidine hcl) .... One tab by mouth three times a day 6)  Glipizide Xl 2.5 Mg  Tb24 (Glipizide) .... One tab by mouth once daily 7)  Lovastatin 40 Mg Tabs (Lovastatin) .... Two tabs by mouth at bedtime 8)  Aspirin 81 Mg Tbec (Aspirin) .... One tab by mouth once daily 9)  Metoprolol Tartrate 50 Mg Tabs (Metoprolol tartrate) .... One tab by mouth two times a day 10)  Triamcinolone Acetonide 0.5 % Crea (Triamcinolone acetonide) .... Apply topically twice daily as needed 11)  Vicodin Es 7.5-750 Mg Tabs (Hydrocodone-acetaminophen) .... Take 1 tablet by mouth two times a day as needed 12)  Amlodipine Besylate 10 Mg Tabs (Amlodipine besylate) .... Take 1 tablet by mouth once a day 13)  Maxzide 75-50 Mg Tabs (Triamterene-hctz) .... Take 1 tablet by mouth once a day 14)  Vitamin D (ergocalciferol) 50000 Unit Caps (Ergocalciferol) .... One tab once a week 15)  Gabapentin 100 Mg Caps (Gabapentin) .... Take 1 capsule by mouth at bedtime  Other Orders: T-Basic Metabolic Panel [id number]) T-Hepatic Function [id number]) T-Lipid Profile [id number]) T- Hemoglobin A1C [id number]) T-TSH [id number]) T-Vitamin D (25-Hydroxy) [id number])  Patient Instructions: 1)  Please schedule a follow-up appointment in 3 months. 2)  It is important that you exercise  regularly at least 20 minutes 5 times a week. If you develop chest pain, have severe difficulty breathing, or feel very tired , stop exercising immediately and seek medical attention. 3)  You need to lose weight. Consider a lower calorie diet and regular exercise.  4)  BMP prior to visit, ICD-9: 5)  Hepatic Panel prior to visit, ICD-9: 6)  Lipid Panel prior to visit, ICD-9:  fasting in 3 months, others today 7)  HbgA1C prior to visit, ICD-9: 8)  You are being referre to a kidney specialist, pls keep appt Prescriptions: GABAPENTIN 100 MG CAPS (GABAPENTIN) Take 1 capsule by mouth at bedtime  #30 x 3   Entered and Authorized by:   [name] MD   Signed by:   [name] MD on 11/13/2009   Method used:   Electronically to        [facility] (retail)       [address] [place], [place]  [address]       Ph: [phone]       Fax: [phone]   RxID:   [id number]

## 2010-04-23 NOTE — Progress Notes (Signed)
Summary: rx  Phone Note Call from Patient   Summary of Call: so her azor has gone up alot. needs to get something cheaper. [phone] Initial call taken by: Rudene Anda,  April 05, 2009 11:45 AM  Follow-up for Phone Call        Cheaper alternative to Azor requested Follow-up by: Everitt Amber,  April 05, 2009 1:01 PM  Additional Follow-up for Phone Call Additional follow up Details #1::        advise amlodipine 10mg  one daily , and increased dose of maxzide to 50mg  daily, I will send in pls write d/c azor and increased  dose of maxzide , and let pt and pharmacy know and fax to pharmacy. pt will need nurse bP check in 6 weeks Additional Follow-up by: [name] MD,  April 06, 2009 1:42 PM    Additional Follow-up for Phone Call Additional follow up Details #2::    Patient aware and will call back to schedule NV Follow-up by: Everitt Amber,  April 06, 2009 3:56 PM  New/Updated Medications: AMLODIPINE BESYLATE 10 MG TABS (AMLODIPINE BESYLATE) Take 1 tablet by mouth once a day MAXZIDE 75-50 MG TABS (TRIAMTERENE-HCTZ) Take 1 tablet by mouth once a day Prescriptions: MAXZIDE 75-50 MG TABS (TRIAMTERENE-HCTZ) Take 1 tablet by mouth once a day  #30 x 3   Entered and Authorized by:   [name] MD   Signed by:   [name] MD on 04/06/2009   Method used:   Printed then faxed to ...       [facility] (retail)       [address]       [place], [place]  [address]       Ph: [phone]       Fax: [phone]   RxID:   [id number] AMLODIPINE BESYLATE 10 MG TABS (AMLODIPINE BESYLATE) Take 1 tablet by mouth once a day  #30 x 3   Entered and Authorized by:   [name] MD   Signed by:   [name] MD on 04/06/2009   Method used:   Printed then faxed to ...       [facility] (retail)       [address]       [place], [place]  [address]       Ph: [phone]       Fax:  [phone]   RxID:   [id number]

## 2010-04-23 NOTE — Assessment & Plan Note (Signed)
Summary: office visit   Vital Signs:  Patient profile:   75 year old female Menstrual status:  hysterectomy Height:      60 inches Weight:      251 pounds BMI:     49.20 O2 Sat:      95 % Pulse rate:   64 / minute Pulse rhythm:   regular Resp:     16 per minute BP sitting:   148 / 80  (left arm) Cuff size:   xl  Vitals Entered By: Everitt Amber LPN (July 05, 2009 9:23 AM)  Nutrition Counseling: Patient's BMI is greater than 25 and therefore counseled on weight management options. CC: Follow up chronic problems   CC:  Follow up chronic problems.  History of Present Illness: Reports  that she has been doing well. Denies recent fever or chills. Denies sinus pressure, nasal congestion , ear pain or sore throat. Denies chest congestion, or cough productive of sputum. Denies chest pain, palpitations, PND, orthopnea or leg swelling. Denies abdominal pain, nausea, vomitting, diarrhea or constipation. Denies change in bowel movements or bloody stool. Denies dysuria , frequency, incontinence or hesitancy. Reports back and joint pain, and isrequesting an injection. She has good blood sugar control, however recent labs showed deterioration in kidney function, so all NSAID's are being witheld.  Denies headaches, vertigo, seizures. Denies depression, anxiety or insomnia. Denies  rash, lesions, or itch.     Current Medications (verified): 1)  Flexeril 5 Mg  Tabs (Cyclobenzaprine Hcl) .... One Tab By Mouth At Bedtime 2)  Klor-Con M20 20 Meq  Tbcr (Potassium Chloride Crys Cr) .... One Tab By Mouth Once Daily 3)  Fexofenadine Hcl 180 Mg  Tabs (Fexofenadine Hcl) .... One Tab By Mouth Once Daily 4)  Hydralazine Hcl 10 Mg  Tabs (Hydralazine Hcl) .... One Tab By Mouth Three Times A Day 5)  Omeprazole 20 Mg  Cpdr (Omeprazole) .... One Cap By Mouth Two Times A Day 6)  Clonidine Hcl 0.3 Mg  Tabs (Clonidine Hcl) .... One Tab By Mouth Three Times A Day 7)  Glipizide Xl 2.5 Mg  Tb24 (Glipizide)  .... One Tab By Mouth Once Daily 8)  Lovastatin 40 Mg  Tabs (Lovastatin) .... Two Tabs By Mouth At Bedtime 9)  Aspirin 81 Mg  Tbec (Aspirin) .... One Tab By Mouth Once Daily 10)  Metoprolol Tartrate 50 Mg  Tabs (Metoprolol Tartrate) .... One Tab By Mouth Two Times A Day 11)  Triamcinolone Acetonide 0.5 %  Crea (Triamcinolone Acetonide) .... Apply Topically Twice Daily As Needed 12)  Sterapred 12 Day 5 Mg Tabs (Prednisone) .... Use As Directed 13)  Vicodin Es 7.5-750 Mg Tabs (Hydrocodone-Acetaminophen) .... Take 1 Tablet By Mouth Two Times A Day As Needed 14)  Amlodipine Besylate 10 Mg Tabs (Amlodipine Besylate) .... Take 1 Tablet By Mouth Once A Day 15)  Maxzide 75-50 Mg Tabs (Triamterene-Hctz) .... Take 1 Tablet By Mouth Once A Day  Allergies (verified): 1)  ! Ace Inhibitors 2)  ! * Arb  Review of Systems      See HPI Eyes:  Denies blurring, discharge, eye pain, and red eye. MS:  Complains of joint pain, low back pain, mid back pain, and stiffness; increased arthritis pain asp back knees and left shoulder. Endo:  Denies cold intolerance, excessive hunger, excessive thirst, excessive urination, heat intolerance, polyuria, and weight change; fastings tested daily , gen less than 100, but over 85. Heme:  Denies abnormal bruising and bleeding. Allergy:  Complains  of seasonal allergies; denies hives or rash and itching eyes.  Physical Exam  General:  Well-developed,obese,in no acute distress; alert,appropriate and cooperative throughout examination HEENT: No facial asymmetry,  EOMI, No sinus tenderness, TM's Clear, oropharynx  pink and moist.   Chest: Clear to auscultation bilaterally.  CVS: S1, S2, No murmurs, No S3.   Abd: Soft, Nontender.  MS: decreased ROM spine, hips,  and knees.  Ext: No edema.   CNS: CN 2-12 intact, power tone and sensation normal throughout.   Skin: Intact, no visible lesions or rashes.  Psych: Good eye contact, normal affect.  Memory intact, not anxious or  depressed appearing.    Impression & Recommendations:  Problem # 1:  KNEE PAIN, BILATERAL (ICD-719.46) Assessment Deteriorated  Her updated medication list for this problem includes:    Flexeril 5 Mg Tabs (Cyclobenzaprine hcl) ..... One tab by mouth at bedtime    Aspirin 81 Mg Tbec (Aspirin) ..... One tab by mouth once daily    Vicodin Es 7.5-750 Mg Tabs (Hydrocodone-acetaminophen) .Marland Kitchen... Take 1 tablet by mouth two times a day as needed  Problem # 2:  DIABETES MELLITUS, TYPE II (ICD-250.00) Assessment: Comment Only  Her updated medication list for this problem includes:    Glipizide Xl 2.5 Mg Tb24 (Glipizide) ..... One tab by mouth once daily    Aspirin 81 Mg Tbec (Aspirin) ..... One tab by mouth once daily  Orders: Glucose, (CBG) [phone])  Labs Reviewed: Creat: 1.74 (03/01/2009)    Reviewed HgBA1c results: 6.4 (03/01/2009)  7.2 (07/31/2008)  Problem # 3:  HYPERTENSION (ICD-401.9) Assessment: Deteriorated  Her updated medication list for this problem includes:    Hydralazine Hcl 10 Mg Tabs (Hydralazine hcl) ..... One tab by mouth three times a day    Clonidine Hcl 0.3 Mg Tabs (Clonidine hcl) ..... One tab by mouth three times a day    Metoprolol Tartrate 50 Mg Tabs (Metoprolol tartrate) ..... One tab by mouth two times a day    Amlodipine Besylate 10 Mg Tabs (Amlodipine besylate) .Marland Kitchen... Take 1 tablet by mouth once a day    Maxzide 75-50 Mg Tabs (Triamterene-hctz) .Marland Kitchen... Take 1 tablet by mouth once a day  Orders: T-Basic Metabolic Panel [phone])  BP today: 148/80 Prior BP: 124/50 (03/01/2009)  Labs Reviewed: K+: 4.6 (03/01/2009) Creat: : 1.74 (03/01/2009)   Chol: 144 (03/01/2009)   HDL: 32 (03/01/2009)   LDL: 40 (03/01/2009)   TG: 358 (03/01/2009)  Problem # 4:  OBESITY (ICD-278.00) Assessment: Improved  Ht: 60 (07/05/2009)   Wt: 251 (07/05/2009)   BMI: 49.20 (07/05/2009)  Complete Medication List: 1)  Flexeril 5 Mg Tabs (Cyclobenzaprine hcl) .... One tab by  mouth at bedtime 2)  Klor-con M20 20 Meq Tbcr (Potassium chloride crys cr) .... One tab by mouth once daily 3)  Fexofenadine Hcl 180 Mg Tabs (Fexofenadine hcl) .... One tab by mouth once daily 4)  Hydralazine Hcl 10 Mg Tabs (Hydralazine hcl) .... One tab by mouth three times a day 5)  Omeprazole 20 Mg Cpdr (Omeprazole) .... One cap by mouth two times a day 6)  Clonidine Hcl 0.3 Mg Tabs (Clonidine hcl) .... One tab by mouth three times a day 7)  Glipizide Xl 2.5 Mg Tb24 (Glipizide) .... One tab by mouth once daily 8)  Lovastatin 40 Mg Tabs (Lovastatin) .... Two tabs by mouth at bedtime 9)  Aspirin 81 Mg Tbec (Aspirin) .... One tab by mouth once daily 10)  Metoprolol Tartrate 50 Mg Tabs (Metoprolol tartrate) .Marland KitchenMarland KitchenMarland Kitchen  One tab by mouth two times a day 11)  Triamcinolone Acetonide 0.5 % Crea (Triamcinolone acetonide) .... Apply topically twice daily as needed 12)  Sterapred 12 Day 5 Mg Tabs (Prednisone) .... Use as directed 13)  Vicodin Es 7.5-750 Mg Tabs (Hydrocodone-acetaminophen) .... Take 1 tablet by mouth two times a day as needed 14)  Amlodipine Besylate 10 Mg Tabs (Amlodipine besylate) .... Take 1 tablet by mouth once a day 15)  Maxzide 75-50 Mg Tabs (Triamterene-hctz) .... Take 1 tablet by mouth once a day  Other Orders: T-Hepatic Function [id number]) T-Lipid Profile [id number]) T- Hemoglobin A1C [id number]) T-Vitamin D (25-Hydroxy) ([id number]) Radiology Referral (Radiology)  Patient Instructions: 1)  Please schedule a follow-up appointment in 3 months. 2)  It is important that you exercise regularly at least 20 minutes 5 times a week. If you develop chest pain, have severe difficulty breathing, or feel very tired , stop exercising immediately and seek medical attention. 3)  You need to lose weight. Consider a lower calorie diet and regular exercise.  4)  BMP prior to visit, ICD-9: 5)  Hepatic Panel prior to visit, ICD-9: 6)  Lipid Panel prior to visit, ICD-9: 7)  HbgA1C prior to  visit, ICD-9:   today 8)  vitamin d 9)  we will resched your [facility]  Laboratory Results   Blood Tests     Glucose (fasting): 108 mg/dL   (Normal Range: 27-253)        Medication Administration  Injection # 1:    Medication: Depo- Medrol 80mg     Diagnosis: BACK PAIN (ICD-724.5)    Route: IM    Site: RUOQ gluteus    Exp Date: 01/2010    Lot #: [id number]   Orders Added: 1)  Glucose, (CBG) [82962] 2)  Est. Patient Level IV [66440] 3)  T-Basic Metabolic Panel [[id number]] 4)  T-Hepatic Function [[id number]] 5)  T-Lipid Profile [[id number]] 6)  T- Hemoglobin A1C [83036-23375] 7)  T-Vitamin D (25-Hydroxy) [[id number]] 8)  Radiology Referral [Radiology]   Appended Document: office visit    Clinical Lists Changes  Orders: Added new Service order of Admin of Therapeutic Inj  intramuscular or subcutaneous ([id number]) - Signed       Orders Added: 1)  Admin of Therapeutic Inj  intramuscular or subcutaneous [[id number]]

## 2010-04-23 NOTE — Letter (Signed)
Summary: medical release  medical release   Imported By: Lind Guest 07/20/2009 14:18:45  _____________________________________________________________________  External Attachment:    Type:   Image     Comment:   External Document

## 2010-04-23 NOTE — Progress Notes (Signed)
Summary: Dr. Carlynn Spry appt.  Phone Note Outgoing Call   Call placed by: Carollee Herter  Call placed to: Dr Carlynn Spry office Summary of Call: I called to get an appt. for MR[facility]. [place], and they state that she had an appt. on Oct. 5 that she resch. to Nov 8 which she ddin't shwo up for, I made her an appt. for Jan. 18 at 13 and both patient and daughter is aware. Initial call taken by: Curtis Sites,  February 13, 2010 10:24 AM  Follow-up for Phone Call        thanks , noted Follow-up by: [name] MD,  February 13, 2010 12:52 PM

## 2010-04-23 NOTE — Miscellaneous (Signed)
Summary: refill;  Prescriptions: VICODIN ES 7.5-750 MG TABS (HYDROCODONE-ACETAMINOPHEN) Take 1 tablet by mouth two times a day as needed  #60 x 2   Entered by:   Worthy Keeler LPN   Authorized by:   [name] MD   Signed by:   Worthy Keeler LPN on 16/12/9602   Method used:   Printed then faxed to ...       [facility] (retail)       [address]       [place], [place]  [address]       Ph: [phone]       Fax: [phone]   RxID:   [id number]

## 2010-04-23 NOTE — Letter (Signed)
Summary: diabetic supplies  diabetic supplies   Imported By: Lind Guest 03/01/2010 16:36:56  _____________________________________________________________________  External Attachment:    Type:   Image     Comment:   External Document

## 2010-04-23 NOTE — Progress Notes (Signed)
Summary: REFILLS  Phone Note Call from Patient   Summary of Call: NEEDS HER ALL HER  MEDICATIONS  REFILLED TODAY SHE IS OUT Initial call taken by: Lind Guest,  July 06, 2009 9:25 AM  Follow-up for Phone Call        already filled Follow-up by: Everitt Amber LPN,  July 06, 2009 9:28 AM

## 2010-04-23 NOTE — Progress Notes (Signed)
  Phone Note Call from Patient   Caller: Daughter Summary of Call: patients daughter called states patient called her states she is not feeling well, states she feels as though her heart is beating out of her chest, advised ER daughter agrees Initial call taken by: Adella Hare LPN,  February 04, 2010 11:20 AM

## 2010-04-23 NOTE — Progress Notes (Signed)
Summary: lancets and strips  Phone Note Call from Patient   Summary of Call: needs her lancets and strips for her free style freedom light send to [facility] Initial call taken by: Lind Guest,  February 13, 2010 11:17 AM  Follow-up for Phone Call        pls send in script for once daily testing x 1 month refill 11 Follow-up by: [name] MD,  February 13, 2010 12:53 PM  Additional Follow-up for Phone Call Additional follow up Details #1::        rx sent Additional Follow-up by: Adella Hare LPN,  February 13, 2010 4:27 PM

## 2010-04-23 NOTE — Letter (Signed)
Summary: LAB ADD ON   LAB ADD ON   Imported By: Lind Guest 11/20/2009 08:09:48  _____________________________________________________________________  External Attachment:    Type:   Image     Comment:   External Document

## 2010-04-23 NOTE — Letter (Signed)
Summary: overnight oximetry  overnight oximetry   Imported By: Lind Guest 02/13/2010 17:32:21  _____________________________________________________________________  External Attachment:    Type:   Image     Comment:   External Document

## 2010-04-23 NOTE — Progress Notes (Signed)
Summary: [facility] needs some papers faxed over  Phone Note Call from Patient   Summary of Call: tammy from [facility] called this morning and also has faxed a papers for some records please fax them back to [phone] or call her at [phone] Initial call taken by: Lind Guest,  February 05, 2010 2:50 PM  Follow-up for Phone Call        sent most recent labs and ov to [facility] Follow-up by: Adella Hare LPN,  February 05, 2010 2:57 PM

## 2010-04-23 NOTE — Progress Notes (Signed)
Summary: oxygen  Phone Note Call from Patient   Summary of Call: called and said she has agreed to do oxygen Initial call taken by: Lind Guest,  February 12, 2010 4:25 PM  Follow-up for Phone Call        pls refer pt for overnight pulse oximetry, eval the need for nocturnal hypoxia, let her know when done pls Follow-up by: [name] MD,  February 13, 2010 7:20 AM  Additional Follow-up for Phone Call Additional follow up Details #1::        rx sent and daughter aware Additional Follow-up by: Adella Hare LPN,  February 13, 2010 3:55 PM

## 2010-04-23 NOTE — Assessment & Plan Note (Signed)
Summary: flu shot  Nurse Visit   Vitals Entered By: Everitt Amber LPN (November 29, 2009 4:50 PM) Comments Patient in to recieve flu vaccine     Allergies: 1)  ! Ace Inhibitors 2)  ! * Arb  Orders Added: 1)  Influenza Vaccine NON MCR [00028]   Influenza Vaccine    Vaccine Type: Fluvax Non-MCR    Site: right deltoid    Mfr: novartis    Dose: 0.5 ml    Route: IM    Given by: Everitt Amber LPN    Exp. Date: 07/2010    Lot #: [id number]

## 2010-04-23 NOTE — Assessment & Plan Note (Signed)
Summary: office visit   Vital Signs:  Patient profile:   75 year old female Menstrual status:  hysterectomy Height:      60 inches Weight:      252.50 pounds BMI:     49.49 O2 Sat:      98 % on Room air Pulse rate:   53 / minute Pulse rhythm:   regular Resp:     16 per minute BP sitting:   140 / 62  (left arm)  Vitals Entered By: Adella Hare LPN (February 12, 2010 3:16 PM)  Nutrition Counseling: Patient's BMI is greater than 25 and therefore counseled on weight management options.  O2 Flow:  Room air CC: hospital follow up Is Patient Diabetic? Yes Did you bring your meter with you today? No   CC:  hospital follow up.  History of Present Illness: Pt recently hospitalised with diastolic heart failure. he is also noted to have chronic renal failure and anemia, an appt has been made to see nephrolgy in the past which she has not followed up on, she states she will keep this new appt. she does note increased fatigue and decreased exercise tolerance.. She denies aNY RECENT FEVER OR CHILLS, HEAD OR CHEST CONGESTION, DYSURIA OR FREQUENCY.   Current Medications (verified): 1)  Klor-Con M20 20 Meq  Tbcr (Potassium Chloride Crys Cr) .... One Tab By Mouth Once Daily 2)  Omeprazole 20 Mg  Cpdr (Omeprazole) .... One Cap By Mouth Two Times A Day 3)  Clonidine Hcl 0.3 Mg  Tabs (Clonidine Hcl) .... One Tab By Mouth Three Times A Day 4)  Glipizide Xl 2.5 Mg  Tb24 (Glipizide) .... One Tab By Mouth Once Daily 5)  Lovastatin 40 Mg  Tabs (Lovastatin) .... Two Tabs By Mouth At Bedtime 6)  Aspirin 81 Mg  Tbec (Aspirin) .... One Tab By Mouth Once Daily 7)  Metoprolol Tartrate 50 Mg  Tabs (Metoprolol Tartrate) .... One Tab By Mouth Two Times A Day 8)  Triamcinolone Acetonide 0.5 %  Crea (Triamcinolone Acetonide) .... Apply Topically Twice Daily As Needed 9)  Vicodin Es 7.5-750 Mg Tabs (Hydrocodone-Acetaminophen) .... Take 1 Tablet By Mouth Two Times A Day As Needed 10)  Amlodipine Besylate 10 Mg  Tabs (Amlodipine Besylate) .... Take 1 Tablet By Mouth Once A Day 11)  Maxzide 75-50 Mg Tabs (Triamterene-Hctz) .... One Half Tablet By Mouth Once A Day 12)  Vitamin D (Ergocalciferol) 50000 Unit Caps (Ergocalciferol) .... One Tab Once A Week 13)  Hydralazine Hcl 25 Mg Tabs (Hydralazine Hcl) .... One Tab By Mouth Three Times A Day 14)  Levothyroxine Sodium 25 Mcg Tabs (Levothyroxine Sodium) .... One Tab By Mouth Once Daily  Allergies (verified): 1)  ! Ace Inhibitors 2)  ! * Arb  Past History:  Past medical, surgical, family and social histories (including risk factors) reviewed, and no changes noted (except as noted below).  Past Medical History: Current Problems:  HYPERLIPIDEMIA (ICD-272.4) OBESITY (ICD-278.00) HYPERTENSION (ICD-401.9) DIABETES MELLITUS, TYPE I (ICD-250.01) BACK PAIN (ICD-724.5) SLEEP APNEA dx in approx 2009, non compliant with CPAP Diastolic dysfunction, hospitalised for 3 days at [facility] , nov 2011 Chronic kidney disease  Past Surgical History: Reviewed history from 08/04/2007 and no changes required. Vaginal hysterectomy -fibroids (1978) Right knee arthroscopy (1976) Right knee replacement  Family History: Reviewed history from 08/04/2007 and no changes required. Mother deceased  father deceased he had heart and kidney dz Sisters 2 living both of whom are HTN and one brother living who is  HTN One brother deceased of renal failure Two sisiters deceased one had end stage renal dz and two has sHTN  Social History: Reviewed history from 08/04/2007 and no changes required. Unemployed Married six children Former Smoker Alcohol use-no Drug use-no  Review of Systems      See HPI General:  Complains of fatigue, malaise, and weakness. Eyes:  Denies blurring and discharge. ENT:  Denies hoarseness, nasal congestion, and sinus pressure. CV:  Complains of shortness of breath with exertion; denies swelling of feet. Resp:  Denies cough and sputum  productive. GI:  Denies abdominal pain, constipation, diarrhea, nausea, and vomiting. GU:  Denies dysuria and urinary frequency. MS:  Complains of joint pain, low back pain, mid back pain, and stiffness. Derm:  Denies itching, lesion(s), and rash. Neuro:  Denies headaches, seizures, and sensation of room spinning. Psych:  Denies anxiety and depression. Endo:  Denies cold intolerance, excessive hunger, excessive thirst, excessive urination, and heat intolerance. Heme:  Denies abnormal bruising and bleeding. Allergy:  Denies hives or rash and itching eyes.  Physical Exam  General:  Well-developed,obese,in no acute distress; alert,appropriate and cooperative throughout examination HEENT: No facial asymmetry,  EOMI, No sinus tenderness, TM's Clear, oropharynx  pink and moist.   Chest: Clear to auscultation bilaterally.  CVS: S1, S2, No murmurs, No S3.   Abd: Soft, Nontender.  MS: decreased ROM spine, hips,  and knees.  Ext: No edema.   CNS: CN 2-12 intact, power tone and sensation normal throughout.   Skin: Intact, no visible lesions or rashes.  Psych: Good eye contact, normal affect.  Memory intact, not anxious or depressed appearing.    Impression & Recommendations:  Problem # 1:  CHRONIC KIDNEY DISEASE STAGE III (MODERATE) (ICD-585.3) Assessment Deteriorated  Orders: Nephrology Referral (Nephro)  Problem # 2:  ABNORMAL THYROID FUNCTION TESTS (ICD-794.5) Assessment: Comment Only  Orders: T-TSH ([id number]) T-T3, Free [phone]) T-Free T4 (23300) new med std in hospital will follow  Problem # 3:  DIABETES MELLITUS, TYPE II (ICD-250.00) Assessment: Comment Only  Her updated medication list for this problem includes:    Glipizide Xl 2.5 Mg Tb24 (Glipizide) ..... One tab by mouth once daily    Aspirin 81 Mg Tbec (Aspirin) ..... One tab by mouth once daily  Orders: T- Hemoglobin A1C ([id number])  Labs Reviewed: Creat: 1.67 (07/05/2009)    Reviewed HgBA1c results:  6.8 (11/19/2009)  6.6 (07/05/2009)  Problem # 4:  HYPERTENSION (ICD-401.9) Assessment: Unchanged  The following medications were removed from the medication list:    Hydralazine Hcl 10 Mg Tabs (Hydralazine hcl) ..... One tab by mouth three times a day Her updated medication list for this problem includes:    Clonidine Hcl 0.3 Mg Tabs (Clonidine hcl) ..... One tab by mouth three times a day    Metoprolol Tartrate 50 Mg Tabs (Metoprolol tartrate) ..... One tab by mouth two times a day    Amlodipine Besylate 10 Mg Tabs (Amlodipine besylate) .Marland Kitchen... Take 1 tablet by mouth once a day    Maxzide 75-50 Mg Tabs (Triamterene-hctz) ..... One half tablet by mouth once a day    Hydralazine Hcl 25 Mg Tabs (Hydralazine hcl) ..... One tab by mouth three times a day  Orders: Medicare Electronic Prescription [phone]) T-Basic Metabolic Panel [phone])  BP today: 140/62 Prior BP: 140/78 (11/13/2009)  Labs Reviewed: K+: 3.9 (07/05/2009) Creat: : 1.67 (07/05/2009)   Chol: 159 (07/05/2009)   HDL: 40 (07/05/2009)   LDL: 57 (07/05/2009)   TG: 312 (07/05/2009)  Complete Medication List: 1)  Klor-con M20 20 Meq Tbcr (Potassium chloride crys cr) .... One tab by mouth once daily 2)  Omeprazole 20 Mg Cpdr (Omeprazole) .... One cap by mouth two times a day 3)  Clonidine Hcl 0.3 Mg Tabs (Clonidine hcl) .... One tab by mouth three times a day 4)  Glipizide Xl 2.5 Mg Tb24 (Glipizide) .... One tab by mouth once daily 5)  Lovastatin 40 Mg Tabs (Lovastatin) .... Two tabs by mouth at bedtime 6)  Aspirin 81 Mg Tbec (Aspirin) .... One tab by mouth once daily 7)  Metoprolol Tartrate 50 Mg Tabs (Metoprolol tartrate) .... One tab by mouth two times a day 8)  Triamcinolone Acetonide 0.5 % Crea (Triamcinolone acetonide) .... Apply topically twice daily as needed 9)  Vicodin Es 7.5-750 Mg Tabs (Hydrocodone-acetaminophen) .... Take 1 tablet by mouth two times a day as needed 10)  Amlodipine Besylate 10 Mg Tabs (Amlodipine  besylate) .... Take 1 tablet by mouth once a day 11)  Maxzide 75-50 Mg Tabs (Triamterene-hctz) .... One half tablet by mouth once a day 12)  Vitamin D (ergocalciferol) 50000 Unit Caps (Ergocalciferol) .... One tab once a week 13)  Hydralazine Hcl 25 Mg Tabs (Hydralazine hcl) .... One tab by mouth three times a day 14)  Levothyroxine Sodium 25 Mcg Tabs (Levothyroxine sodium) .... One tab by mouth once daily 15)  Triamcinolone Acetonide 0.5 % Crea (Triamcinolone acetonide) .... Apply twice daily sparingly  to affected area as needed 16)  Freedom Freestyle Lite Strips and Lancets  .... Once daily testing dx: 250.00  Patient Instructions: 1)  Please schedule a follow-up appointment in 3 months. 2)  You need to lose weight. Consider a lower calorie diet and regular exercise.  3)  BMP prior to visit, ICD-9: 4)  TSH prior to visit, ICD-9:  in 3 months, free T3 and T4 5)  HbgA1C prior to visit, ICD-9: 6)  pLs keepappt with the nephrologist,you need to have your kidneys followed by a specialist Prescriptions: OMEPRAZOLE 20 MG  CPDR (OMEPRAZOLE) one cap by mouth two times a day  #60 Each x 3   Entered by:   Adella Hare LPN   Authorized by:   [name] MD   Signed by:   Adella Hare LPN on 88/41/6606   Method used:   Electronically to        [facility]* (retail)       [address] [place], [place]  [address]       Ph: [phone]       Fax: [phone]   RxID:   [id number] KLOR-CON M20 20 MEQ  TBCR (POTASSIUM CHLORIDE CRYS CR) one tab by mouth once daily  #30 x 3   Entered by:   Adella Hare LPN   Authorized by:   [name] MD   Signed by:   Adella Hare LPN on 37/12/6267   Method used:   Electronically to        [facility] (retail)       [address]       [place], [place]  [address]       Ph: [phone]       Fax: [phone]   RxID:   [id number] VITAMIN D (ERGOCALCIFEROL) 50000  UNIT CAPS (ERGOCALCIFEROL) one tab once a week  #4 x 3  Entered by:   Adella Hare LPN   Authorized by:   [name] MD   Signed by:   Adella Hare LPN on 16/12/9602   Method used:   Electronically to        [facility]* (retail)       [address] [place], [place]  [address]       Ph: [phone]       Fax: [phone]   RxID:   [id number] TRIAMCINOLONE ACETONIDE 0.5 % CREA (TRIAMCINOLONE ACETONIDE) apply twice daily sparingly  to affected area as needed  #45 gm x 0   Entered and Authorized by:   [name] MD   Signed by:   [name] MD on 02/12/2010   Method used:   Electronically to        [facility] (retail)       [address]       [place], [place]  [address]       Ph: [phone]       Fax: [phone]   RxID:   [id number]    Orders Added: 1)  Est. Patient Level IV [[address]] 2)  Medicare Electronic Prescription [G8553] 3)  T-Basic Metabolic Panel [80048-22910] 4)  T- Hemoglobin A1C [83036-23375] 5)  T-TSH [10932-35573] 6)  T-T3, Free [22025-42706] 7)  T-Free T4 [23300] 8)  Nephrology Referral [Nephro]

## 2010-04-23 NOTE — Letter (Signed)
Summary: DID NOT QUALIFY FOR O2  DID NOT QUALIFY FOR O2   Imported By: Lind Guest 02/20/2010 09:50:20  _____________________________________________________________________  External Attachment:    Type:   Image     Comment:   External Document

## 2010-04-23 NOTE — Letter (Signed)
Summary: MEDICAL RELEASE  MEDICAL RELEASE   Imported By: Lind Guest 06/01/2009 13:47:55  _____________________________________________________________________  External Attachment:    Type:   Image     Comment:   External Document

## 2010-04-23 NOTE — Miscellaneous (Signed)
Summary: refill  Clinical Lists Changes  Medications: Rx of KLOR-CON M20 20 MEQ  TBCR (POTASSIUM CHLORIDE CRYS CR) one tab by mouth once daily;  #30 x 4;  Signed;  Entered by: Worthy Keeler LPN;  Authorized by: [name] MD;  Method used: Electronically to [facility] [address], [place], [place], [place]  [address], Ph: [phone], Fax: [phone]    Prescriptions: KLOR-CON M20 20 MEQ  TBCR (POTASSIUM CHLORIDE CRYS CR) one tab by mouth once daily  #30 x 4   Entered by:   Worthy Keeler LPN   Authorized by:   [name] MD   Signed by:   Worthy Keeler LPN on 13/10/6576   Method used:   Electronically to        [facility]* (retail)       [address] [place], [place]  [address]       Ph: [phone]       Fax: [phone]   RxID:   [id number]

## 2010-04-23 NOTE — Progress Notes (Signed)
Summary: [facility]  Phone Note Call from Patient   Summary of Call: [facility] WILL BE FAXING OVER RX FOR HER A METER  DIABETIC SUPPLIES  KNEE BRACE   BACK BRACE  AND SOMETHING FOR ANKLES  Terri Kelly  SAID WAS ON THE OTHER PHONE  SAID TO OK THIS Initial call taken by: Lind Guest,  February 25, 2010 3:29 PM  Follow-up for Phone Call        patient aware we will sign only for diabetic supplies, others have to go to ortho Follow-up by: Adella Hare LPN,  February 27, 2010 11:15 AM

## 2010-04-23 NOTE — Letter (Signed)
Summary: external records  external records   Imported By: Curtis Sites 11/13/2009 08:43:56  _____________________________________________________________________  External Attachment:    Type:   Image     Comment:   External Document

## 2010-04-23 NOTE — Progress Notes (Signed)
Summary: would like injection  Phone Note Call from Patient   Summary of Call: leaving for vacation on friday and would like to get a injestion before she goes [phone] Initial call taken by: Rudene Anda,  October 01, 2009 10:19 AM  Follow-up for Phone Call        ok depomedrol 80mg  iM and she needs ov by august Follow-up by: [name] MD,  October 01, 2009 5:02 PM  Additional Follow-up for Phone Call Additional follow up Details #1::        please have patient schedule nv for this Additional Follow-up by: Adella Hare LPN,  October 02, 2009 8:54 AM    Additional Follow-up for Phone Call Additional follow up Details #2::    HAS A OV 8.23.11 Follow-up by: Lind Guest,  October 02, 2009 9:44 AM  Additional Follow-up for Phone Call Additional follow up Details #3:: Details for Additional Follow-up Action Taken: noted pls give the injection this week Additional Follow-up by: [name] MD,  October 02, 2009 9:46 AM

## 2010-04-25 NOTE — Progress Notes (Signed)
  Phone Note Other Incoming   Caller: dr simpson Summary of Call: pls let pt know that she should test her blood sugar ONCE daily, that's all that's authorised, she is not o insulin and has excellent blood sugar control Initial call taken by: [name] MD,  March 12, 2010 7:33 PM  Follow-up for Phone Call        patient aware Follow-up by: Adella Hare LPN,  March 13, 2010 10:33 AM

## 2010-04-25 NOTE — Letter (Signed)
Summary: HANDICAPP card  HANDICAPP card   Imported By: Lind Guest 04/19/2010 08:40:03  _____________________________________________________________________  External Attachment:    Type:   Image     Comment:   External Document

## 2010-04-25 NOTE — Letter (Signed)
Summary: MEDICAL RELEASE  MEDICAL RELEASE   Imported By: Lind Guest 04/15/2010 13:16:13  _____________________________________________________________________  External Attachment:    Type:   Image     Comment:   External Document

## 2010-04-25 NOTE — Letter (Signed)
Summary: diabetic testing supplies  diabetic testing supplies   Imported By: Lind Guest 03/11/2010 09:37:42  _____________________________________________________________________  External Attachment:    Type:   Image     Comment:   External Document

## 2010-04-25 NOTE — Assessment & Plan Note (Signed)
Summary: injection  Nurse Visit   Vital Signs:  Patient profile:   75 year old female Menstrual status:  hysterectomy Weight:      254 pounds BP sitting:   160 / 80  (left arm)  Vitals Entered By: Adella Hare LPN (March 14, 2010 10:28 AM) CC: back pain for over a week   Allergies: 1)  ! Ace Inhibitors 2)  ! * Arb  Medication Administration  Injection # 1:    Medication: Depo- Medrol 80mg     Diagnosis: BACK PAIN (ICD-724.5)    Route: IM    Site: RUOQ gluteus    Exp Date: 07/12    Lot #: Gunnar Bulla    Mfr: Pharm[employer]    Comments: depo medrol 120mg  given    Patient tolerated injection without complications    Given by: Adella Hare LPN (March 14, 2010 10:29 AM)  Orders Added: 1)  Depo- Medrol 80mg  [J1040] 2)  Admin of Therapeutic Inj  intramuscular or subcutaneous [96372]   Medication Administration  Injection # 1:    Medication: Depo- Medrol 80mg     Diagnosis: BACK PAIN (ICD-724.5)    Route: IM    Site: RUOQ gluteus    Exp Date: 07/12    Lot #: Gunnar Bulla    Mfr: Pharmacia    Comments: depo medrol 120mg  given    Patient tolerated injection without complications    Given by: Adella Hare LPN (March 14, 2010 10:29 AM)  Orders Added: 1)  Depo- Medrol 80mg  [J1040] 2)  Admin of Therapeutic Inj  intramuscular or subcutaneous [96372] medadministered, no complications

## 2010-04-25 NOTE — Progress Notes (Signed)
Summary: please advise  Phone Note Other Incoming   Summary of Call: [employer] called and was asking about Terri Kelly's oxygen. she says it was tested over 2 weeks ago.please call her at work  [phone]  Initial call taken by: Curtis Sites,  March 04, 2010 10:08 AM  Follow-up for Phone Call        pls let herknow she does not need/qualify for oxygen Follow-up by: [name] MD,  March 06, 2010 7:01 AM  Additional Follow-up for Phone Call Additional follow up Details #1::        patient is aware Additional Follow-up by: Adella Hare LPN,  March 06, 2010 10:13 AM

## 2010-04-25 NOTE — Progress Notes (Signed)
Summary: SHOT  Phone Note Other Incoming   Caller: OMEGA  DAUGHTER Summary of Call: WANTSTO KNOW CAN Quynn COME IN AND GET A SHOT TO HELP HER MOVE AROUND BETTER SHE HAS HAD ONE BEFORE AND ALSO HER BROTHER THAT JUST PAST AWAY SHE WANTS TO KNOW WILL YOU START SEEING HIS WIFE SHE SAID PLEASE AND CALL TO OMEGA AT WORK AND LET HER KNOW AT [phone] Initial call taken by: Lind Guest,  March 11, 2010 10:01 AM  Follow-up for Phone Call        ok depomedrol 120mg  IM , pls document iin record where she hurts and for how long it has been bad.  New appt for her sister in law , not before March pls let her know Follow-up by: [name] MD,  March 11, 2010 12:36 PM  Additional Follow-up for Phone Call Additional follow up Details #1::        BACK AND LEGS HURTING 1 WK. Additional Follow-up by: Lind Guest,  March 11, 2010 2:12 PM    Additional Follow-up for Phone Call Additional follow up Details #2::    did you let patient know to come in for nurse visit? Follow-up by: Adella Hare LPN,  March 11, 2010 2:17 PM  Additional Follow-up for Phone Call Additional follow up Details #3:: Details for Additional Follow-up Action Taken: yes I did she is going to call me back when she can get here Additional Follow-up by: Lind Guest,  March 11, 2010 2:24 PM  noted

## 2010-04-25 NOTE — Progress Notes (Signed)
Summary: DIABETIC SUPPLIES  Phone Note Call from Patient   Summary of Call: [facility] CALLED [facility]/W REPRESENATIVE ADVISE REP THAT DOCTOR WAS ONLY GOING TO SIGN OFF ON JUST THE  DIABETIC SUPPLIES NOT FOR THE KNEE BRACE ON BACK BRACE.. Initial call taken by: Eugenio Hoes,  March 05, 2010 11:45 AM  Follow-up for Phone Call        noted Follow-up by: Adella Hare LPN,  March 06, 2010 9:20 AM

## 2010-04-25 NOTE — Progress Notes (Signed)
Summary: NEED OXYGEN  Phone Note Call from Patient   Summary of Call: PATIENT DAUGHTER CALLED AGAIN WANTING TO KNOW WHATS GOING ON WITH HER MOTHER OXYGEN REQUESTING FOR SOMEONE TO GIVE HER A CALL BACK TO LET HER KNOW WHATS GOING ON. Initial call taken by: Eugenio Hoes,  March 05, 2010 11:38 AM  Follow-up for Phone Call        pls let her know the test shows she does NOT need oxygen Follow-up by: [name] MD,  March 06, 2010 6:49 AM  Additional Follow-up for Phone Call Additional follow up Details #1::        patient aware Additional Follow-up by: Adella Hare LPN,  March 06, 2010 9:27 AM

## 2010-04-25 NOTE — Letter (Signed)
Summary: rest/gated stress  rest/gated stress   Imported By: Lind Guest 03/06/2010 10:25:50  _____________________________________________________________________  External Attachment:    Type:   Image     Comment:   External Document

## 2010-04-25 NOTE — Letter (Signed)
Summary: DIABETIC TESTING SUPPLIES  DIABETIC TESTING SUPPLIES   Imported By: Lind Guest 03/13/2010 13:01:24  _____________________________________________________________________  External Attachment:    Type:   Image     Comment:   External Document

## 2010-04-26 ENCOUNTER — Encounter: Payer: Self-pay | Admitting: Family Medicine

## 2010-04-26 NOTE — Letter (Signed)
Summary: Historic Patient File  Historic Patient File   Imported By: Lind Guest 08/17/2009 14:13:24  _____________________________________________________________________  External Attachment:    Type:   Image     Comment:   External Document

## 2010-05-08 ENCOUNTER — Telehealth: Payer: Self-pay | Admitting: Family Medicine

## 2010-05-14 ENCOUNTER — Other Ambulatory Visit: Payer: Self-pay | Admitting: Family Medicine

## 2010-05-14 ENCOUNTER — Ambulatory Visit: Payer: Self-pay | Admitting: Family Medicine

## 2010-05-14 ENCOUNTER — Encounter: Payer: Self-pay | Admitting: Family Medicine

## 2010-05-14 ENCOUNTER — Ambulatory Visit (INDEPENDENT_AMBULATORY_CARE_PROVIDER_SITE_OTHER): Payer: PRIVATE HEALTH INSURANCE | Admitting: Family Medicine

## 2010-05-14 DIAGNOSIS — M2540 Effusion, unspecified joint: Secondary | ICD-10-CM

## 2010-05-14 DIAGNOSIS — M79609 Pain in unspecified limb: Secondary | ICD-10-CM | POA: Insufficient documentation

## 2010-05-14 DIAGNOSIS — M25569 Pain in unspecified knee: Secondary | ICD-10-CM

## 2010-05-14 DIAGNOSIS — G4730 Sleep apnea, unspecified: Secondary | ICD-10-CM | POA: Insufficient documentation

## 2010-05-14 DIAGNOSIS — M549 Dorsalgia, unspecified: Secondary | ICD-10-CM

## 2010-05-14 DIAGNOSIS — I10 Essential (primary) hypertension: Secondary | ICD-10-CM

## 2010-05-14 DIAGNOSIS — E109 Type 1 diabetes mellitus without complications: Secondary | ICD-10-CM

## 2010-05-15 NOTE — Progress Notes (Signed)
Summary: medicine  Phone Note Call from Patient   Summary of Call: needs her levothyroxine and hydralazine to [facility]. she had done called this is in and has been waiting Initial call taken by: Lind Guest,  May 08, 2010 8:25 AM  Follow-up for Phone Call        Was waiting on approval from Dr. Lodema Hong since she doesnt normally fill these meds  Refills sent to [facility] per DR Follow-up by: Everitt Amber LPN,  May 08, 2010 9:39 AM    Prescriptions: HYDRALAZINE HCL 25 MG TABS (HYDRALAZINE HCL) one tab by mouth three times a day  #90 x 1   Entered by:   Everitt Amber LPN   Authorized by:   [name] MD   Signed by:   Everitt Amber LPN on 16/12/9602   Method used:   Electronically to        [facility]* (retail)       [address]       [place], [place]  [address]       Ph: [phone]       Fax: [phone]   RxID:   [id number] LEVOTHYROXINE SODIUM 25 MCG TABS (LEVOTHYROXINE SODIUM) one tab by mouth once daily  #30 x 1   Entered by:   Everitt Amber LPN   Authorized by:   [name] MD   Signed by:   Everitt Amber LPN on 01/18/2535   Method used:   Electronically to        [facility] (retail)       [address] [place], [place]  [address]       Ph: [phone]       Fax: [phone]   RxID:   [id number]

## 2010-05-16 ENCOUNTER — Encounter: Payer: Self-pay | Admitting: Family Medicine

## 2010-05-16 ENCOUNTER — Ambulatory Visit (INDEPENDENT_AMBULATORY_CARE_PROVIDER_SITE_OTHER): Payer: Medicare Other

## 2010-05-16 DIAGNOSIS — M549 Dorsalgia, unspecified: Secondary | ICD-10-CM

## 2010-05-16 LAB — CONVERTED CEMR LAB
Calcium: 9.7 mg/dL (ref 8.4–10.5)
Creatinine, Ser: 2.19 mg/dL — ABNORMAL HIGH (ref 0.40–1.20)
Free T4: 0.78 ng/dL — ABNORMAL LOW (ref 0.80–1.80)
Hgb A1c MFr Bld: 6.8 % — ABNORMAL HIGH (ref <5.7)

## 2010-05-21 ENCOUNTER — Ambulatory Visit (HOSPITAL_COMMUNITY)
Admission: RE | Admit: 2010-05-21 | Discharge: 2010-05-21 | Disposition: A | Discharge status: Home / Self Care | Payer: Medicare Other | Source: Ambulatory Visit | Attending: Family Medicine | Admitting: Family Medicine

## 2010-05-21 DIAGNOSIS — M79609 Pain in unspecified limb: Secondary | ICD-10-CM | POA: Insufficient documentation

## 2010-05-21 DIAGNOSIS — M7989 Other specified soft tissue disorders: Secondary | ICD-10-CM | POA: Insufficient documentation

## 2010-05-21 DIAGNOSIS — M2540 Effusion, unspecified joint: Secondary | ICD-10-CM

## 2010-05-21 NOTE — Letter (Signed)
Summary: dose increase  dose increase   Imported By: Lind Guest 05/16/2010 10:46:18  _____________________________________________________________________  External Attachment:    Type:   Image     Comment:   External Document

## 2010-05-21 NOTE — Assessment & Plan Note (Signed)
Summary: injection  Nurse Visit   Vital Signs:  Patient profile:   75 year old female Menstrual status:  hysterectomy Weight:      250.25 pounds BP sitting:   150 / 70  (left arm)  Vitals Entered By: Adella Hare LPN (May 16, 2010 10:10 AM) CC: chronic arthiritic pain   Allergies: 1)  ! Ace Inhibitors 2)  ! * Arb  Medication Administration  Injection # 1:    Medication: Depo- Medrol 80mg     Diagnosis: BACK PAIN (ICD-724.5)    Route: IM    Site: RUOQ gluteus    Exp Date: 11/12    Lot #: [id number]    Mfr: Pharmacia    Patient tolerated injection without complications    Given by: Adella Hare LPN (May 16, 2010 10:15 AM)  Orders Added: 1)  Depo- Medrol 80mg  [J1040] 2)  Admin of Therapeutic Inj  intramuscular or subcutaneous [96372]   Medication Administration  Injection # 1:    Medication: Depo- Medrol 80mg     Diagnosis: BACK PAIN (ICD-724.5)    Route: IM    Site: RUOQ gluteus    Exp Date: 11/12    Lot #: [id number]    Mfr: Pharmacia    Patient tolerated injection without complications    Given by: Adella Hare LPN (May 16, 2010 10:15 AM)  Orders Added: 1)  Depo- Medrol 80mg  [J1040] 2)  Admin of Therapeutic Inj  intramuscular or subcutaneous [96372] injection administered with no adverse effect

## 2010-05-30 NOTE — Assessment & Plan Note (Signed)
Summary: FOLLOW UP   Vital Signs:  Patient profile:   75 year old female Menstrual status:  hysterectomy Height:      60 inches Weight:      252.50 pounds BMI:     49.49 O2 Sat:      93 % Pulse rate:   63 / minute Pulse rhythm:   regular Resp:     16 per minute BP sitting:   170 / 70  (left arm) Cuff size:   xl  Vitals Entered By: Everitt Amber LPN (May 14, 2010 3:25 PM)  Nutrition Counseling: Patient's BMI is greater than 25 and therefore counseled on weight management options. CC: c/o arthritis pain in legs and shoulders   CC:  c/o arthritis pain in legs and shoulders.  History of Present Illness: Reports  that apart from her joints , she feels fairlywell. Denies recent fever or chills. Denies sinus pressure, nasal congestion , ear pain or sore throat. Denies chest congestion, or cough productive of sputum. Denies chest pain, palpitations, PND, orthopnea or leg swelling. Denies abdominal pain, nausea, vomitting, diarrhea or constipation. Denies change in bowel movements or bloody stool. Denies dysuria , frequency, incontinence or hesitancy. . Denies headaches, vertigo, seizures.  Denies  rash, lesions, or itch.     Current Medications (verified): 1)  Klor-Con M20 20 Meq  Tbcr (Potassium Chloride Crys Cr) .... One Tab By Mouth Once Daily 2)  Omeprazole 20 Mg  Cpdr (Omeprazole) .... One Cap By Mouth Two Times A Day 3)  Clonidine Hcl 0.3 Mg  Tabs (Clonidine Hcl) .... One Tab By Mouth Three Times A Day 4)  Glipizide Xl 2.5 Mg  Tb24 (Glipizide) .... One Tab By Mouth Once Daily 5)  Lovastatin 40 Mg  Tabs (Lovastatin) .... Two Tabs By Mouth At Bedtime 6)  Aspirin 81 Mg  Tbec (Aspirin) .... One Tab By Mouth Once Daily 7)  Metoprolol Tartrate 50 Mg  Tabs (Metoprolol Tartrate) .... One Tab By Mouth Two Times A Day 8)  Triamcinolone Acetonide 0.5 %  Crea (Triamcinolone Acetonide) .... Apply Topically Twice Daily As Needed 9)  Vicodin Es 7.5-750 Mg Tabs  (Hydrocodone-Acetaminophen) .... Take 1 Tablet By Mouth Two Times A Day As Needed 10)  Amlodipine Besylate 10 Mg Tabs (Amlodipine Besylate) .... Take 1 Tablet By Mouth Once A Day 11)  Maxzide 75-50 Mg Tabs (Triamterene-Hctz) .... One Half Tablet By Mouth Once A Day 12)  Vitamin D (Ergocalciferol) 50000 Unit Caps (Ergocalciferol) .... One Tab Once A Week 13)  Hydralazine Hcl 25 Mg Tabs (Hydralazine Hcl) .... One Tab By Mouth Three Times A Day 14)  Levothyroxine Sodium 25 Mcg Tabs (Levothyroxine Sodium) .... One Tab By Mouth Once Daily 15)  Triamcinolone Acetonide 0.5 % Crea (Triamcinolone Acetonide) .... Apply Twice Daily Sparingly  To Affected Area As Needed 16)  Freedom Freestyle Lite Strips and Lancets .... Once Daily Testing Dx: 250.00  Allergies (verified): 1)  ! Ace Inhibitors 2)  ! * Arb  Review of Systems      See HPI General:  Complains of fatigue, malaise, and sleep disorder. Eyes:  Complains of vision loss-both eyes; corrective lenses. CV:  Complains of swelling of feet; c/o swelling and pain  of leg, concerned about a possible clot . MS:  Complains of joint pain, low back pain, mid back pain, and stiffness; increased and uncontrolled pain affecting all joints and causing her to feel depressed.Marland Kitchen Psych:  Complains of depression and easily tearful; denies suicidal thoughts/plans, thoughts  of violence, and unusual visions or sounds; frusdtrated with constant joint pain and debility. Endo:  Denies excessive hunger, excessive thirst, and excessive urination; tests once daily fastings avg110 to 126. Heme:  Denies abnormal bruising and bleeding. Allergy:  Denies hives or rash and itching eyes.  Physical Exam  General:  Well-developed,obese,in no acute distress; alert,appropriate and cooperative throughout examination HEENT: No facial asymmetry,  EOMI, No sinus tenderness, TM's Clear, oropharynx  pink and moist.   Chest: Clear to auscultation bilaterally.  CVS: S1, S2, No murmurs, No  S3.   Abd: Soft, Nontender.  MS: decreased ROM spine, hips,  and knees.  FAO:ZHYQMV swelling of right calf with tenderness, homann's neg CNS: CN 2-12 intact, power tone and sensation normal throughout.   Skin: Intact, no visible lesions or rashes.  Psych: Good eye contact, normal affect.  Memory intact,tearful at times, and depressed appearing.    Impression & Recommendations:  Problem # 1:  LEG PAIN (ICD-729.5) Assessment Deteriorated  Orders: Radiology Referral (Radiology)  Problem # 2:  SLEEP APNEA (ICD-780.57) Assessment: Deteriorated  Orders: Sleep Study (Sleep Study), pt not using machine, needs to be re-tested  Problem # 3:  DIABETES MELLITUS, TYPE II (ICD-250.00) Assessment: Comment Only  Her updated medication list for this problem includes:    Glipizide Xl 2.5 Mg Tb24 (Glipizide) ..... One tab by mouth once daily    Aspirin 81 Mg Tbec (Aspirin) ..... One tab by mouth once daily  Labs Reviewed: Creat: 1.67 (07/05/2009)    Reviewed HgBA1c results: 6.8 (11/19/2009)  6.6 (07/05/2009)  Problem # 4:  HYPERTENSION (ICD-401.9) Assessment: Unchanged  The following medications were removed from the medication list:    Hydralazine Hcl 25 Mg Tabs (Hydralazine hcl) ..... One tab by mouth three times a day Her updated medication list for this problem includes:    Clonidine Hcl 0.3 Mg Tabs (Clonidine hcl) ..... One tab by mouth three times a day    Metoprolol Tartrate 50 Mg Tabs (Metoprolol tartrate) ..... One tab by mouth two times a day    Amlodipine Besylate 10 Mg Tabs (Amlodipine besylate) .Marland Kitchen... Take 1 tablet by mouth once a day    Maxzide 75-50 Mg Tabs (Triamterene-hctz) ..... One half tablet by mouth once a day    Hydralazine Hcl 50 Mg Tabs (Hydralazine hcl) .Marland Kitchen... Take 1 tablet by mouth two times a day dose increase effective 05/14/2010  Orders: Medicare Electronic Prescription [phone])  BP today: 170/70 Prior BP: 160/80 (03/14/2010)  Labs Reviewed: K+: 3.9  (07/05/2009) Creat: : 1.67 (07/05/2009)   Chol: 159 (07/05/2009)   HDL: 40 (07/05/2009)   LDL: 57 (07/05/2009)   TG: 312 (07/05/2009)  Problem # 5:  BACK PAIN (ICD-724.5) Assessment: Deteriorated  The following medications were removed from the medication list:    Vicodin Es 7.5-750 Mg Tabs (Hydrocodone-acetaminophen) .Marland Kitchen... Take 1 tablet by mouth two times a day as needed Her updated medication list for this problem includes:    Aspirin 81 Mg Tbec (Aspirin) ..... One tab by mouth once daily    Vicodin Es 7.5-750 Mg Tabs (Hydrocodone-acetaminophen) .Marland Kitchen... Take 1 tablet by mouth three times a day dose increase beffective 05/14/2010  Complete Medication List: 1)  Klor-con M20 20 Meq Tbcr (Potassium chloride crys cr) .... One tab by mouth once daily 2)  Omeprazole 20 Mg Cpdr (Omeprazole) .... One cap by mouth two times a day 3)  Clonidine Hcl 0.3 Mg Tabs (Clonidine hcl) .... One tab by mouth three times a day 4)  Glipizide Xl 2.5 Mg Tb24 (Glipizide) .... One tab by mouth once daily 5)  Lovastatin 40 Mg Tabs (Lovastatin) .... Two tabs by mouth at bedtime 6)  Aspirin 81 Mg Tbec (Aspirin) .... One tab by mouth once daily 7)  Metoprolol Tartrate 50 Mg Tabs (Metoprolol tartrate) .... One tab by mouth two times a day 8)  Triamcinolone Acetonide 0.5 % Crea (Triamcinolone acetonide) .... Apply topically twice daily as needed 9)  Amlodipine Besylate 10 Mg Tabs (Amlodipine besylate) .... Take 1 tablet by mouth once a day 10)  Maxzide 75-50 Mg Tabs (Triamterene-hctz) .... One half tablet by mouth once a day 11)  Vitamin D (ergocalciferol) 50000 Unit Caps (Ergocalciferol) .... One tab once a week 12)  Levothyroxine Sodium 25 Mcg Tabs (Levothyroxine sodium) .... One tab by mouth once daily 13)  Triamcinolone Acetonide 0.5 % Crea (Triamcinolone acetonide) .... Apply twice daily sparingly  to affected area as needed 14)  Freedom Freestyle Lite Strips and Lancets  .... Once daily testing dx: 250.00 15)  Vicodin  Es 7.5-750 Mg Tabs (Hydrocodone-acetaminophen) .... Take 1 tablet by mouth three times a day dose increase beffective 05/14/2010 16)  Hydralazine Hcl 50 Mg Tabs (Hydralazine hcl) .... Take 1 tablet by mouth two times a day dose increase effective 05/14/2010  Patient Instructions: 1)  F/U in 6 weeks. 2)  dose inc on pain med to 3 times daily. 3)  nEW dOSE of hYDRALLAZINE 25mg  TWO twice daily, till done, the nnEW 50mg  oNE twice daily, your bP is high. 4)  you do need to get another sleep study, 5)  you are being sent for a study to ensure no clot in your leg Prescriptions: TRIAMCINOLONE ACETONIDE 0.5 % CREA (TRIAMCINOLONE ACETONIDE) apply twice daily sparingly  to affected area as needed  #45 gm x 3   Entered by:   Everitt Amber LPN   Authorized by:   [name] MD   Signed by:   Everitt Amber LPN on 16/12/9602   Method used:   Electronically to        [facility]* (retail)       [address] [place], [place]  [address]       Ph: [phone]       Fax: [phone]   RxID:   [id number] LEVOTHYROXINE SODIUM 25 MCG TABS (LEVOTHYROXINE SODIUM) one tab by mouth once daily  #30 x 3   Entered by:   Everitt Amber LPN   Authorized by:   [name] MD   Signed by:   Everitt Amber LPN on 01/18/2535   Method used:   Electronically to        [facility]* (retail)       [address] [place], [place]  [address]       Ph: [phone]       Fax: [phone]   RxID:   [id number] AMLODIPINE BESYLATE 10 MG TABS (AMLODIPINE BESYLATE) Take 1 tablet by mouth once a day  #30 Each x 3   Entered by:   Everitt Amber LPN   Authorized by:   [name] MD   Signed by:   Everitt Amber LPN on 32/35/5732   Method used:   Electronically to        [facility]* (retail)       [address]       [place], [place]  [address]       Ph: [phone]       Fax: [phone]   RxID:    [id number] METOPROLOL TARTRATE 50 MG  TABS (METOPROLOL TARTRATE) one tab by mouth two times a day  #60 Each x 3   Entered by:   Everitt Amber LPN   Authorized by:   [name] MD   Signed by:   Everitt Amber LPN on 25/36/6440   Method used:   Electronically to        [facility] (retail)       [address] [place], [place]  [address]       Ph: [phone]       Fax: [phone]   RxID:   [id number] LOVASTATIN 40 MG  TABS (LOVASTATIN) two tabs by mouth at bedtime  #60 Each x 3   Entered by:   Everitt Amber LPN   Authorized by:   [name] MD   Signed by:   Everitt Amber LPN on 57/32/2025   Method used:   Electronically to        [facility] (retail)       [address] [place], [place]  [address]       Ph: [phone]       Fax: [phone]   RxID:   [id number] GLIPIZIDE XL 2.5 MG  TB24 (GLIPIZIDE) one tab by mouth once daily  #30 Each x 3   Entered by:   Everitt Amber LPN   Authorized by:   [name] MD   Signed by:   Everitt Amber LPN on 93/81/8299   Method used:   Electronically to        [facility] (retail)       [address] [place], [place]  [address]       Ph: [phone]       Fax: [phone]   RxID:   [id number] CLONIDINE HCL 0.3 MG  TABS (CLONIDINE HCL) one tab by mouth three times a day  #90 Each x 3   Entered by:   Everitt Amber LPN   Authorized by:   [name] MD   Signed by:   Everitt Amber LPN on 08/67/6195   Method used:   Electronically to        [facility] (retail)       [address] [place], [place]  [address]       Ph: [phone]       Fax: [phone]   RxID:   [id number] OMEPRAZOLE 20 MG  CPDR (OMEPRAZOLE) one cap by mouth two times a day  #60 Each x 3   Entered by:   Everitt Amber LPN   Authorized by:   [name] MD   Signed by:    Everitt Amber LPN on 53/29/9242   Method used:   Electronically to        [facility]* (retail)       [address]  [place], [place]  [address]       Ph: [phone]       Fax: [phone]   RxID:   [id number] HYDRALAZINE HCL 50 MG TABS (HYDRALAZINE HCL) Take 1 tablet by mouth two times a day dose increase effective 05/14/2010  #60 x 3   Entered and Authorized by:   [name] MD   Signed by:   [name] MD on 05/14/2010   Method used:   Printed then faxed to ...       [facility]* (retail)       [address]       [place], [place]  [address]       Ph: [phone]       Fax: [phone]   RxID:   [id number] VICODIN ES 7.5-750 MG TABS (HYDROCODONE-ACETAMINOPHEN) Take 1 tablet by mouth three times a day dose increase beffective 05/14/2010  #90 x 2   Entered and Authorized by:   [name] MD   Signed by:   [name] MD on 05/14/2010   Method used:   Printed then faxed to ...       [facility]* (retail)       [address]       [place], [place]  [address]       Ph: [phone]       Fax: [phone]   RxID:   [id number]    Orders Added: 1)  Est. Patient Level IV [27[address]] 2)  Medicare Electronic Prescription [G8553] 3)  Radiology Referral [Radiology] 4)  Sleep Study [Sleep Study]

## 2010-06-04 LAB — URINE MICROSCOPIC-ADD ON

## 2010-06-04 LAB — DIFFERENTIAL
Basophils Absolute: 0.1 10*3/uL (ref 0.0–0.1)
Basophils Absolute: 0.1 10*3/uL (ref 0.0–0.1)
Basophils Relative: 1 % (ref 0–1)
Basophils Relative: 1 % (ref 0–1)
Basophils Relative: 1 % (ref 0–1)
Eosinophils Absolute: 0.2 10*3/uL (ref 0.0–0.7)
Eosinophils Absolute: 0.2 10*3/uL (ref 0.0–0.7)
Eosinophils Relative: 1 % (ref 0–5)
Eosinophils Relative: 2 % (ref 0–5)
Eosinophils Relative: 2 % (ref 0–5)
Lymphs Abs: 2.5 10*3/uL (ref 0.7–4.0)
Monocytes Absolute: 0.9 10*3/uL (ref 0.1–1.0)
Monocytes Absolute: 1 10*3/uL (ref 0.1–1.0)
Monocytes Relative: 10 % (ref 3–12)
Monocytes Relative: 8 % (ref 3–12)
Monocytes Relative: 9 % (ref 3–12)
Neutro Abs: 6.7 10*3/uL (ref 1.7–7.7)

## 2010-06-04 LAB — BASIC METABOLIC PANEL
BUN: 27 mg/dL — ABNORMAL HIGH (ref 6–23)
CO2: 20 mEq/L (ref 19–32)
Calcium: 9.9 mg/dL (ref 8.4–10.5)
Chloride: 108 mEq/L (ref 96–112)
Creatinine, Ser: 1.95 mg/dL — ABNORMAL HIGH (ref 0.4–1.2)
GFR calc Af Amer: 31 mL/min — ABNORMAL LOW (ref >60)
GFR calc non Af Amer: 23 mL/min — ABNORMAL LOW (ref >60)
Glucose, Bld: 120 mg/dL — ABNORMAL HIGH (ref 70–99)
Glucose, Bld: 135 mg/dL — ABNORMAL HIGH (ref 70–99)
Sodium: 139 mEq/L (ref 135–145)

## 2010-06-04 LAB — URINALYSIS, ROUTINE W REFLEX MICROSCOPIC
Glucose, UA: NEGATIVE mg/dL
Hgb urine dipstick: NEGATIVE
Leukocytes, UA: NEGATIVE
Protein, ur: 100 mg/dL — AB
Specific Gravity, Urine: 1.025 (ref 1.005–1.030)

## 2010-06-04 LAB — COMPREHENSIVE METABOLIC PANEL
ALT: 17 U/L (ref 0–35)
AST: 23 U/L (ref 0–37)
Albumin: 3.8 g/dL (ref 3.5–5.2)
Alkaline Phosphatase: 94 U/L (ref 39–117)
BUN: 23 mg/dL (ref 6–23)
Chloride: 112 mEq/L (ref 96–112)
Potassium: 3.9 mEq/L (ref 3.5–5.1)
Sodium: 142 mEq/L (ref 135–145)
Total Bilirubin: 0.4 mg/dL (ref 0.3–1.2)
Total Protein: 7.3 g/dL (ref 6.0–8.3)

## 2010-06-04 LAB — POCT CARDIAC MARKERS
CKMB, poc: 2 ng/mL (ref 1.0–8.0)
CKMB, poc: 2.5 ng/mL (ref 1.0–8.0)
Myoglobin, poc: 411 ng/mL (ref 12–200)
Myoglobin, poc: 454 ng/mL (ref 12–200)
Troponin i, poc: <0.05 ng/mL (ref 0.00–0.09)

## 2010-06-04 LAB — CBC
HCT: 28 % — ABNORMAL LOW (ref 36.0–46.0)
HCT: 30.8 % — ABNORMAL LOW (ref 36.0–46.0)
Hemoglobin: 9.6 g/dL — ABNORMAL LOW (ref 12.0–15.0)
MCH: 26.5 pg (ref 26.0–34.0)
MCH: 26.7 pg (ref 26.0–34.0)
MCHC: 33.2 g/dL (ref 30.0–36.0)
MCHC: 33.4 g/dL (ref 30.0–36.0)
MCV: 79.6 fL (ref 78.0–100.0)
MCV: 79.8 fL (ref 78.0–100.0)
MCV: 79.9 fL (ref 78.0–100.0)
Platelets: 292 10*3/uL (ref 150–400)
RBC: 3.51 MIL/uL — ABNORMAL LOW (ref 3.87–5.11)
RBC: 3.62 MIL/uL — ABNORMAL LOW (ref 3.87–5.11)
RDW: 16.1 % — ABNORMAL HIGH (ref 11.5–15.5)
RDW: 16.2 % — ABNORMAL HIGH (ref 11.5–15.5)
WBC: 9.4 10*3/uL (ref 4.0–10.5)

## 2010-06-04 LAB — CARDIAC PANEL(CRET KIN+CKTOT+MB+TROPI)
CK, MB: 3.6 ng/mL (ref 0.3–4.0)
CK, MB: 4.4 ng/mL — ABNORMAL HIGH (ref 0.3–4.0)
Relative Index: 0.8 (ref 0.0–2.5)
Relative Index: 0.8 (ref 0.0–2.5)
Total CK: 393 U/L — ABNORMAL HIGH (ref 7–177)
Total CK: 464 U/L — ABNORMAL HIGH (ref 7–177)
Troponin I: 0.03 ng/mL (ref 0.00–0.06)

## 2010-06-04 LAB — GLUCOSE, CAPILLARY
Glucose-Capillary: 125 mg/dL — ABNORMAL HIGH (ref 70–99)
Glucose-Capillary: 152 mg/dL — ABNORMAL HIGH (ref 70–99)

## 2010-06-04 LAB — LIPID PANEL
Triglycerides: 321 mg/dL — ABNORMAL HIGH (ref <150)
VLDL: 64 mg/dL — ABNORMAL HIGH (ref 0–40)

## 2010-06-04 LAB — T3, FREE: T3, Free: 2.4 pg/mL (ref 2.3–4.2)

## 2010-06-04 LAB — T4, FREE: Free T4: 0.76 ng/dL — ABNORMAL LOW (ref 0.80–1.80)

## 2010-06-04 LAB — HEMOCCULT GUIAC POC 1CARD (OFFICE)
Fecal Occult Bld: NEGATIVE
Fecal Occult Bld: NEGATIVE

## 2010-06-04 LAB — FERRITIN: Ferritin: 85 ng/mL (ref 10–291)

## 2010-07-29 ENCOUNTER — Other Ambulatory Visit: Payer: Self-pay | Admitting: Family Medicine

## 2010-07-29 DIAGNOSIS — Z139 Encounter for screening, unspecified: Secondary | ICD-10-CM

## 2010-08-05 ENCOUNTER — Ambulatory Visit (HOSPITAL_COMMUNITY): Payer: Medicare Other

## 2010-08-09 NOTE — Op Note (Signed)
Terri Kelly, Terri Kelly                        ACCOUNT NO.:  [id number]   MEDICAL RECORD NO.:  [id number]                   PATIENT TYPE:  INP   LOCATION:  2550                                 FACILITY:  [facility]   PHYSICIAN:  Robert A. Thurston Hole, M.D.              DATE OF BIRTH:  08-Aug-1935   DATE OF PROCEDURE:  04/05/2003  DATE OF DISCHARGE:                                 OPERATIVE REPORT   PREOPERATIVE DIAGNOSIS:  Right knee degenerative joint disease.   POSTOPERATIVE DIAGNOSIS:  Right knee degenerative joint disease.   PROCEDURE:  Right total knee replacement using [employer]ics Scorpio total knee  system with #7 cemented femoral component, #9 cemented tibial component with  12 mm polyethylene flex tibial spacer and 20 mm polyethylene cemented  patella.   SURGEON:  Elana Alm. Thurston Hole, M.D.   ASSISTANT:  Julien Girt, P.A.   ANESTHESIA:  General anesthesia.   OPERATIVE TIME:  1 hour 30 minutes.   COMPLICATIONS:  None.   DESCRIPTION OF PROCEDURE:  The patient was brought to the operating room on  April 05, 2003, and placed on the operating table in the supine position.  After an adequate level of general anesthesia was obtained, she had a Foley  catheter placed under sterile conditions and received Ancef 1 gram IV  preoperatively for prophylaxis.  The right knee was examined under  anesthesia, range of motion from -3 to 125 degrees and mild varus deformity.  Knee stable to ligamentous examination with normal patella tracking.  The  right leg was then prepped using sterile Duraprep and draped using sterile  technique.  The leg was exsanguinated and a tourniquet elevated 380 mmHg.  Initially, through a 15 to 20 cm longitudinal incision based over the  patella, initial exposure was made.  The underlying subcutaneous tissues  were incised along with the skin incision.  A median arthrotomy was  performed revealing an excessive amount of normal appearing joint fluid.  The  articular surfaces were inspected.  She had grade III and IV changes  medial compartment, lateral compartment, and patellofemoral joint.  The  medial and lateral meniscal remnants were removed as well as the anterior  cruciate ligament.  The osteophytes were removed off the femoral condyles  and tibial plateau.  An intramedullary drill was then drilled up the femoral  canal for placement of the distal femoral cutting jig which was placed in  appropriate amount of rotation and a distal 12 mm cut was made.  The distal  femur was then sized.  #7 was found to be the appropriate size and a #7  cutting jig was placed and then these cuts were made.  The proximal tibia  was then exposed.  The tibial spines were removed with an oscillating saw.  Intramedullary drill drilled down the tibial canal for placement of the  proximal tibial cutting jig which was placed in the appropriate amount of  rotation and  a proximal 6 mm cut was made.  At this was done, the Scorpio  PCL cutter was placed back on the distal femur and these cuts were made.  At  this point, the #7 femoral trial was placed.  #9 tibial base plate trial was  placed and a 12 mm polyethylene flex tibial spacer, there was found to be  excellent restoration and normal alignment, excellent stability.  Range of  motion 0 to 120 degrees.  The tibial baseplate was then marked for rotation  and the keel cut was made.  After this was done, the patella was size.  28  mm was found to be the appropriate size and a recess 10 mm x 28 mm cut was  made and three locking holes were placed.  After this was done, it was felt  that all of the trial components were of excellent size, fit, and stability.  They were then removed.  The knee was then jet lavage irrigated with 3  liters of saline solution.  The proximal tibia was then exposed and the #9  tibial baseplate was cemented back and was hammered into position with an  excellent fit with excess cement being  removed from around the edges.  The  #7 femoral component with cement backing was hammered into position also  with an excellent fit with excess cement being removed from around the  edges.  The 12 mm polyethylene flex tibial spacer was then locked onto the  tibial baseplate and the knee taken through a range of motion 0 to 120  degrees with excellent stability, normal alignment, and no lift off on the  tray.  The 28 mm polyethylene cement back patella was then locked into its  recess hole and held there with a clamp.  After the cement hardened,  patellofemoral tracking was evaluated and this was found to be normal.  At  this point, it was felt that all of the components were of excellent size,  fit, and stability.  The knee was further irrigated with antibiotic solution  and then the arthrotomy was closed with #1 Ethibond suture over two medium  Hemovac drains.  Subcutaneous tissues closed with 0 and 2-0 Vicryl.  Skin  closed with skin staples.  Sterile dressings were applied.  The Hemovac  injected with 0.25% Marcaine with epinephrine and 4 mg of morphine and  clamped.  The tourniquet was released.  The patient then had a femoral nerve  block placed by anesthesia for postoperative pain control.  She was then  awakened, extubated, and taken to the recovery room in stable condition.  Needle, sponge, and instrument counts correct at the end of the case.                                               Robert A. Thurston Hole, M.D.    RAW/MEDQ  D:  04/05/2003  T:  04/05/2003  Job:  [id number]

## 2010-08-09 NOTE — Discharge Summary (Signed)
Terri Kelly, Terri Kelly                        ACCOUNT NO.:  [id number]   MEDICAL RECORD NO.:  [id number]                   PATIENT TYPE:  INP   LOCATION:  5032                                 FACILITY:  [facility]   PHYSICIAN:  Elana Alm. Thurston Hole, M.D.              DATE OF BIRTH:  [birth year]   DATE OF ADMISSION:  04/05/2003  DATE OF DISCHARGE:  04/09/2003                                 DISCHARGE SUMMARY   ADMISSION DIAGNOSES:  1. End stage degenerative joint disease right knee.  2. Hypertension.   DISCHARGE DIAGNOSES:  1. End stage degenerative joint disease right knee.  2. Hypertension.   HISTORY OF PRESENT ILLNESS:  The patient is a 75 year old black female who  has end stage DJD of her right knee. She has tried anti-inflammatories,  cortisone injections, and rest all without success. She understands the  risks, benefits and possible complications of a right total knee  replacement.   PROCEDURES IN HOUSE:  On April 05, 2003 the patient underwent a right  total knee replacement by Dr. Thurston Hole and a femoral nerve block by  anesthesia. She tolerated both procedures well and was admitted  postoperatively for pain control, DVT prophylaxis and physical therapy.  Postop day one the patient was doing well. Surgical wound was well  approximated. Her hemoglobin was 8.9. She was metabolically stable except  for some decreased renal function with a creatinine of 2.2. She was given  500 cc. volume bolus and normal saline x 2. Postop day two her creatinine  was back down to 1.3. Her hemoglobin was 9. Surgical wound was well  approximately. She was progressing with physical therapy. She was  complaining of some gas. We started her on Reglan. She was given a  suppository this day. Postop day three the patient was afebrile, alert,  oriented and progressed with physical therapy, had a bowel movement. Her  hemoglobin was 7.2. She was transfused two units of packed red blood cells.  She tolerated the  transfusion well. Postop day four her hemoglobin was 9.1.  She felt significantly better. She was discharged to home in stable  condition, weightbearing as tolerated, on a regular diet. Percocet for pain.  Coumadin, Catapres, Actos, Diovan, Lotrel and Robaxin. She has been  instructed to call with increased pain, increased redness, increased  drainage or a temp greater than 101. Her followup appointment will be  April 20, 2003.      Kirstin Shepperson, P.A.                  Robert A. Thurston Hole, M.D.    KS/MEDQ  D:  05/09/2003  T:  05/09/2003  Job:  [id number]

## 2010-09-05 ENCOUNTER — Other Ambulatory Visit: Payer: Self-pay | Admitting: Family Medicine

## 2010-09-14 ENCOUNTER — Other Ambulatory Visit: Payer: Self-pay | Admitting: Family Medicine

## 2010-10-03 ENCOUNTER — Other Ambulatory Visit: Payer: Self-pay | Admitting: Family Medicine

## 2010-10-04 ENCOUNTER — Other Ambulatory Visit: Payer: Self-pay

## 2010-10-04 ENCOUNTER — Telehealth: Payer: Self-pay | Admitting: Family Medicine

## 2010-10-04 MED ORDER — METOPROLOL TARTRATE 50 MG PO TABS: 50.0000 mg | ORAL_TABLET | Freq: Two times a day (BID) | ORAL | Status: DC

## 2010-10-04 MED ORDER — AMLODIPINE BESYLATE 10 MG PO TABS: 10.0000 mg | ORAL_TABLET | Freq: Every day | ORAL | Status: DC

## 2010-10-04 MED ORDER — TRIAMTERENE-HCTZ 75-50 MG PO TABS: 1.0000 | ORAL_TABLET | Freq: Every day | ORAL | Status: DC

## 2010-10-04 NOTE — Telephone Encounter (Signed)
Faxed meds in

## 2010-10-08 ENCOUNTER — Ambulatory Visit (HOSPITAL_COMMUNITY)
Admission: RE | Admit: 2010-10-08 | Discharge: 2010-10-08 | Disposition: A | Discharge status: Home / Self Care | Payer: Medicare Other | Source: Ambulatory Visit | Attending: Family Medicine | Admitting: Family Medicine

## 2010-10-08 DIAGNOSIS — Z1231 Encounter for screening mammogram for malignant neoplasm of breast: Secondary | ICD-10-CM | POA: Insufficient documentation

## 2010-10-08 DIAGNOSIS — Z139 Encounter for screening, unspecified: Secondary | ICD-10-CM

## 2010-11-13 ENCOUNTER — Other Ambulatory Visit: Payer: Self-pay | Admitting: Family Medicine

## 2010-12-06 ENCOUNTER — Ambulatory Visit (INDEPENDENT_AMBULATORY_CARE_PROVIDER_SITE_OTHER): Payer: Medicare Other

## 2010-12-06 DIAGNOSIS — Z23 Encounter for immunization: Secondary | ICD-10-CM

## 2010-12-09 DIAGNOSIS — Z23 Encounter for immunization: Secondary | ICD-10-CM

## 2011-01-03 ENCOUNTER — Other Ambulatory Visit: Payer: Self-pay | Admitting: Family Medicine

## 2011-01-07 ENCOUNTER — Other Ambulatory Visit: Payer: Self-pay | Admitting: Family Medicine

## 2011-01-08 ENCOUNTER — Telehealth: Payer: Self-pay | Admitting: Family Medicine

## 2011-01-09 MED ORDER — HYDROCODONE-ACETAMINOPHEN 7.5-750 MG PO TABS: ORAL_TABLET | ORAL | Status: DC

## 2011-01-09 NOTE — Telephone Encounter (Signed)
Sent!

## 2011-02-04 ENCOUNTER — Other Ambulatory Visit: Payer: Self-pay | Admitting: Family Medicine

## 2011-02-10 ENCOUNTER — Other Ambulatory Visit: Payer: Self-pay | Admitting: Family Medicine

## 2011-02-19 ENCOUNTER — Other Ambulatory Visit (HOSPITAL_COMMUNITY): Payer: Self-pay | Admitting: Family Medicine

## 2011-02-19 ENCOUNTER — Ambulatory Visit (HOSPITAL_COMMUNITY): Payer: Medicare Other

## 2011-02-19 DIAGNOSIS — M549 Dorsalgia, unspecified: Secondary | ICD-10-CM

## 2011-03-12 ENCOUNTER — Other Ambulatory Visit: Payer: Self-pay | Admitting: Family Medicine

## 2011-03-12 ENCOUNTER — Other Ambulatory Visit: Payer: Self-pay

## 2011-03-12 MED ORDER — HYDROCODONE-ACETAMINOPHEN 7.5-750 MG PO TABS: ORAL_TABLET | ORAL | Status: DC

## 2011-04-01 ENCOUNTER — Other Ambulatory Visit: Payer: Self-pay | Admitting: Family Medicine

## 2011-04-04 ENCOUNTER — Other Ambulatory Visit: Payer: Self-pay | Admitting: Family Medicine

## 2011-04-09 ENCOUNTER — Encounter: Payer: Self-pay | Admitting: Family Medicine

## 2011-04-14 ENCOUNTER — Ambulatory Visit (INDEPENDENT_AMBULATORY_CARE_PROVIDER_SITE_OTHER): Payer: Self-pay | Admitting: Family Medicine

## 2011-04-14 VITALS — BP 140/60 | HR 81 | Resp 18 | Ht 60.0 in | Wt 245.1 lb

## 2011-04-14 DIAGNOSIS — I10 Essential (primary) hypertension: Secondary | ICD-10-CM

## 2011-04-14 DIAGNOSIS — R5383 Other fatigue: Secondary | ICD-10-CM

## 2011-04-14 DIAGNOSIS — M549 Dorsalgia, unspecified: Secondary | ICD-10-CM

## 2011-04-14 DIAGNOSIS — M25569 Pain in unspecified knee: Secondary | ICD-10-CM

## 2011-04-14 DIAGNOSIS — E119 Type 2 diabetes mellitus without complications: Secondary | ICD-10-CM

## 2011-04-14 DIAGNOSIS — E669 Obesity, unspecified: Secondary | ICD-10-CM

## 2011-04-14 DIAGNOSIS — E785 Hyperlipidemia, unspecified: Secondary | ICD-10-CM

## 2011-04-14 DIAGNOSIS — R5381 Other malaise: Secondary | ICD-10-CM

## 2011-04-14 MED ORDER — METHYLPREDNISOLONE ACETATE 80 MG/ML IJ SUSP
80.0000 mg | Freq: Once | INTRAMUSCULAR | Status: AC
  Administered 2011-04-14: 80 mg via INTRAMUSCULAR

## 2011-04-14 MED ORDER — KETOROLAC TROMETHAMINE 60 MG/2ML IJ SOLN
60.0000 mg | Freq: Once | INTRAMUSCULAR | Status: AC
  Administered 2011-04-14: 60 mg via INTRAMUSCULAR

## 2011-04-14 NOTE — Patient Instructions (Addendum)
F/U in 4 month  Fasting cBc, cmp, lipid, hBA1C and tSH as soon as possible, this week  Congrats on weight loss , keep it up  Injections  for pain today  You are referred for eye exam  HBa1C, tSH  and chem 7 non fasting in 4 months, before next visit

## 2011-04-14 NOTE — Assessment & Plan Note (Signed)
Uncontrolled pt to get toradol and depo medrol in office

## 2011-04-16 ENCOUNTER — Other Ambulatory Visit: Payer: Self-pay

## 2011-04-16 MED ORDER — HYDRALAZINE HCL 50 MG PO TABS: ORAL_TABLET | ORAL | Status: DC

## 2011-04-16 MED ORDER — HYDROCODONE-ACETAMINOPHEN 7.5-750 MG PO TABS: ORAL_TABLET | ORAL | Status: DC

## 2011-04-16 MED ORDER — CLONIDINE HCL 0.3 MG PO TABS: ORAL_TABLET | ORAL | Status: DC

## 2011-04-16 MED ORDER — TRIAMTERENE-HCTZ 75-50 MG PO TABS: 1.0000 | ORAL_TABLET | Freq: Every day | ORAL | Status: DC

## 2011-04-16 MED ORDER — LOVASTATIN 40 MG PO TABS: ORAL_TABLET | ORAL | Status: DC

## 2011-04-16 MED ORDER — LEVOTHYROXINE SODIUM 25 MCG PO TABS: ORAL_TABLET | ORAL | Status: DC

## 2011-04-16 MED ORDER — GLIPIZIDE ER 2.5 MG PO TB24: ORAL_TABLET | ORAL | Status: DC

## 2011-04-16 MED ORDER — OMEPRAZOLE 20 MG PO CPDR: DELAYED_RELEASE_CAPSULE | ORAL | Status: DC

## 2011-04-21 ENCOUNTER — Encounter: Payer: Self-pay | Admitting: Family Medicine

## 2011-04-21 NOTE — Assessment & Plan Note (Signed)
Uncontrolled, anti inflammatory injection administered at visit

## 2011-04-21 NOTE — Progress Notes (Signed)
  Subjective:    Patient ID: Terri Kelly, female    DOB: Mar 05, 1936, 76 y.o.   MRN: [id number]  HPI The PT is here for follow up and re-evaluation of chronic medical conditions, medication management and review of any available recent lab and radiology data.  Preventive health is updated, specifically  Cancer screening and Immunization.   Questions or concerns regarding consultations or procedures which the PT has had in the interim are  addressed. The PT denies any adverse reactions to current medications since the last visit.  There are no new concerns.  C/o uncontrolled back and knee pain requests injection in the office for this Reports fasting sugars seldom over 120,denies polyuria, polydipsia, blurred vision    Review of Systems See HPI Denies recent fever or chills. Denies sinus pressure, nasal congestion, ear pain or sore throat. Denies chest congestion, productive cough or wheezing. Denies chest pains, palpitations and leg swelling Denies abdominal pain, nausea, vomiting,diarrhea or constipation.   Denies dysuria, frequency, hesitancy or incontinence.  Denies headaches, seizures, numbness, or tingling. Denies depression, anxiety or insomnia. Denies skin break down or rash.        Objective:   Physical Exam  Patient alert and oriented and in no cardiopulmonary distress.  HEENT: No facial asymmetry, EOMI, no sinus tenderness,  oropharynx pink and moist.  Neck supple no adenopathy.  Chest: Clear to auscultation bilaterally.  CVS: S1, S2 no murmurs, no S3.  ABD: Soft non tender. Bowel sounds normal.  Ext: No edema  WU:[id number]  ROM spine, shoulders, hips and knees.  Skin: Intact, no ulcerations or rash noted.  Psych: Good eye contact, normal affect. Memory intact not anxious or depressed appearing.  CNS: CN 2-12 intact, power, tone and sensation normal throughout.       Assessment & Plan:

## 2011-04-21 NOTE — Assessment & Plan Note (Signed)
Controlled, no change in medication  

## 2011-04-21 NOTE — Assessment & Plan Note (Signed)
Pt' reports fasting blood sugars of under 120, labs are past due and need to be done asap

## 2011-04-21 NOTE — Assessment & Plan Note (Signed)
Low fat diet discussed and encouraged.Pt to continue medication as before. Labs past due and are to be done asap

## 2011-04-21 NOTE — Assessment & Plan Note (Signed)
Improved. Pt applauded on succesful weight loss through lifestyle change, and encouraged to continue same. Weight loss goal set for the next several months.  

## 2011-04-22 ENCOUNTER — Other Ambulatory Visit: Payer: Self-pay | Admitting: Family Medicine

## 2011-04-23 LAB — ANEMIA PANEL
Ferritin: 91 ng/mL (ref 10–291)
Folate: 14.6 ng/mL
RBC.: 3.65 MIL/uL — ABNORMAL LOW (ref 3.87–5.11)
Retic Ct Pct: 1.8 % (ref 0.4–2.3)
Vitamin B-12: 403 pg/mL (ref 211–911)

## 2011-04-23 LAB — LIPID PANEL
LDL Cholesterol: 56 mg/dL (ref 0–99)
Total CHOL/HDL Ratio: 4.6 Ratio
VLDL: 55 mg/dL — ABNORMAL HIGH (ref 0–40)

## 2011-04-23 LAB — COMPREHENSIVE METABOLIC PANEL
BUN: 34 mg/dL — ABNORMAL HIGH (ref 6–23)
CO2: 18 mEq/L — ABNORMAL LOW (ref 19–32)
Creat: 2.26 mg/dL — ABNORMAL HIGH (ref 0.50–1.10)
Glucose, Bld: 125 mg/dL — ABNORMAL HIGH (ref 70–99)
Sodium: 141 mEq/L (ref 135–145)
Total Bilirubin: 0.2 mg/dL — ABNORMAL LOW (ref 0.3–1.2)
Total Protein: 7.7 g/dL (ref 6.0–8.3)

## 2011-04-23 LAB — CREATININE WITH EST GFR
Creat: 2.26 mg/dL — ABNORMAL HIGH (ref 0.50–1.10)
GFR, Est Non African American: 21 mL/min — ABNORMAL LOW

## 2011-04-23 LAB — CBC
HCT: 30.5 % — ABNORMAL LOW (ref 36.0–46.0)
Hemoglobin: 9.1 g/dL — ABNORMAL LOW (ref 12.0–15.0)
MCH: 24.9 pg — ABNORMAL LOW (ref 26.0–34.0)
MCHC: 29.8 g/dL — ABNORMAL LOW (ref 30.0–36.0)
MCV: 83.3 fL (ref 78.0–100.0)
RBC: 3.66 MIL/uL — ABNORMAL LOW (ref 3.87–5.11)

## 2011-05-17 ENCOUNTER — Other Ambulatory Visit: Payer: Self-pay | Admitting: Family Medicine

## 2011-06-10 ENCOUNTER — Other Ambulatory Visit: Payer: Self-pay | Admitting: Family Medicine

## 2011-07-04 ENCOUNTER — Other Ambulatory Visit: Payer: Self-pay | Admitting: Family Medicine

## 2011-07-17 ENCOUNTER — Other Ambulatory Visit: Payer: Self-pay | Admitting: Family Medicine

## 2011-08-01 ENCOUNTER — Other Ambulatory Visit: Payer: Self-pay | Admitting: Family Medicine

## 2011-08-08 ENCOUNTER — Other Ambulatory Visit: Payer: Self-pay | Admitting: Family Medicine

## 2011-08-12 ENCOUNTER — Ambulatory Visit: Payer: PRIVATE HEALTH INSURANCE | Admitting: Family Medicine

## 2011-08-18 ENCOUNTER — Other Ambulatory Visit: Payer: Self-pay | Admitting: Family Medicine

## 2011-09-01 ENCOUNTER — Other Ambulatory Visit: Payer: Self-pay | Admitting: Family Medicine

## 2011-09-02 ENCOUNTER — Ambulatory Visit (INDEPENDENT_AMBULATORY_CARE_PROVIDER_SITE_OTHER): Payer: Self-pay | Admitting: Family Medicine

## 2011-09-02 ENCOUNTER — Encounter: Payer: Self-pay | Admitting: Family Medicine

## 2011-09-02 VITALS — BP 150/70 | HR 69 | Resp 18 | Ht 60.0 in | Wt 241.0 lb

## 2011-09-02 DIAGNOSIS — N183 Chronic kidney disease, stage 3 unspecified: Secondary | ICD-10-CM

## 2011-09-02 DIAGNOSIS — I10 Essential (primary) hypertension: Secondary | ICD-10-CM

## 2011-09-02 DIAGNOSIS — E119 Type 2 diabetes mellitus without complications: Secondary | ICD-10-CM

## 2011-09-02 DIAGNOSIS — M25569 Pain in unspecified knee: Secondary | ICD-10-CM

## 2011-09-02 DIAGNOSIS — R946 Abnormal results of thyroid function studies: Secondary | ICD-10-CM

## 2011-09-02 DIAGNOSIS — E669 Obesity, unspecified: Secondary | ICD-10-CM

## 2011-09-02 LAB — HEMOGLOBIN A1C: Mean Plasma Glucose: 134 mg/dL — ABNORMAL HIGH (ref <117)

## 2011-09-02 MED ORDER — HYDROCODONE-ACETAMINOPHEN 7.5-750 MG PO TABS: 1.0000 | ORAL_TABLET | Freq: Three times a day (TID) | ORAL | Status: DC

## 2011-09-02 MED ORDER — PREDNISONE (PAK) 5 MG PO TABS: 5.0000 mg | ORAL_TABLET | ORAL | Status: DC

## 2011-09-02 MED ORDER — CLONIDINE HCL 0.3 MG PO TABS: 0.3000 mg | ORAL_TABLET | Freq: Three times a day (TID) | ORAL | Status: DC

## 2011-09-02 MED ORDER — METOPROLOL TARTRATE 50 MG PO TABS: 50.0000 mg | ORAL_TABLET | Freq: Two times a day (BID) | ORAL | Status: DC

## 2011-09-02 MED ORDER — METHYLPREDNISOLONE ACETATE 80 MG/ML IJ SUSP
80.0000 mg | Freq: Once | INTRAMUSCULAR | Status: AC
  Administered 2011-09-02: 80 mg via INTRAMUSCULAR

## 2011-09-02 MED ORDER — HYDRALAZINE HCL 50 MG PO TABS: ORAL_TABLET | ORAL | Status: DC

## 2011-09-02 MED ORDER — AMLODIPINE BESYLATE 10 MG PO TABS: 10.0000 mg | ORAL_TABLET | Freq: Every day | ORAL | Status: DC

## 2011-09-02 MED ORDER — TRIAMTERENE-HCTZ 75-50 MG PO TABS: 1.0000 | ORAL_TABLET | Freq: Every day | ORAL | Status: DC

## 2011-09-02 NOTE — Progress Notes (Signed)
  Subjective:    Patient ID: Terri Kelly, female    DOB: 1935/10/17, 76 y.o.   MRN: [id number]  HPI The PT is here for follow up and re-evaluation of chronic medical conditions, medication management and review of any available recent lab and radiology data.  Preventive health is updated, specifically  Cancer screening and Immunization.   The PT denies any adverse reactions to current medications since the last visit.  C/o worsening bilateral knee pain with limitation in mobility. She has not fallen Denies polyuria, polydipsia or blurred vision, fasting sugars are skldom over 120    Review of Systems See HPI Denies recent fever or chills. Denies sinus pressure, nasal congestion, ear pain or sore throat. Denies chest congestion, productive cough or wheezing. Denies chest pains, palpitations and leg swelling Denies abdominal pain, nausea, vomiting,diarrhea or constipation.   Denies dysuria, frequency, hesitancy or incontinence.  Denies headaches, seizures, numbness, or tingling. Denies depression, anxiety or insomnia. Denies skin break down or rash.        Objective:   Physical Exam  Patient alert and oriented and in no cardiopulmonary distress.  HEENT: No facial asymmetry, EOMI, no sinus tenderness,  oropharynx pink and moist.  Neck decreased though adequate ROM no adenopathy.  Chest: Clear to auscultation bilaterally.  CVS: S1, S2 no murmurs, no S3.  ABD: Soft non tender. Bowel sounds normal.  Ext: No edema  MS: decreased  ROM spine, shoulders, hips and knees.  Skin: Intact, no ulcerations or rash noted.  Psych: Good eye contact, normal affect. Memory intact not anxious or depressed appearing.  CNS: CN 2-12 intact, power, normal throughout.       Assessment & Plan:

## 2011-09-02 NOTE — Patient Instructions (Addendum)
F/u in 4 month, rectal exam at that visit  Urine sent today for microalbumin  Depo medrol 80mg  IM for knee pain. Prednisone also sent to your pharmacy  Congrats on continued weight loss.Continue your thankful and peaceful attitude to life and family  No changes in chronic medication  Mammogram due July 18 or after, please schedule  HBA1C, cmp and EGFR, tSH today

## 2011-09-03 LAB — COMPLETE METABOLIC PANEL WITH GFR
ALT: 10 U/L (ref 0–35)
Albumin: 4.4 g/dL (ref 3.5–5.2)
CO2: 16 mEq/L — ABNORMAL LOW (ref 19–32)
Calcium: 9.3 mg/dL (ref 8.4–10.5)
Chloride: 113 mEq/L — ABNORMAL HIGH (ref 96–112)
GFR, Est African American: 16 mL/min — ABNORMAL LOW
GFR, Est Non African American: 14 mL/min — ABNORMAL LOW
Glucose, Bld: 108 mg/dL — ABNORMAL HIGH (ref 70–99)
Potassium: 4.7 mEq/L (ref 3.5–5.3)
Sodium: 141 mEq/L (ref 135–145)
Total Bilirubin: 0.2 mg/dL — ABNORMAL LOW (ref 0.3–1.2)
Total Protein: 7.8 g/dL (ref 6.0–8.3)

## 2011-09-03 LAB — MICROALBUMIN / CREATININE URINE RATIO: Creatinine, Urine: 110.6 mg/dL

## 2011-09-14 ENCOUNTER — Other Ambulatory Visit: Payer: Self-pay | Admitting: Family Medicine

## 2011-09-14 DIAGNOSIS — N186 End stage renal disease: Secondary | ICD-10-CM

## 2011-09-14 NOTE — Assessment & Plan Note (Signed)
Controlled, no change in medication  

## 2011-09-14 NOTE — Assessment & Plan Note (Signed)
Increased and uncontrolled anti inflammatory injection at [facility]

## 2011-09-14 NOTE — Assessment & Plan Note (Signed)
Pt has declined seeing nephrologist in the past, but with worsening function it is very important that she go, will discuss with her daughter

## 2011-09-14 NOTE — Assessment & Plan Note (Signed)
Uncontrolled, no med changes at this time 

## 2011-09-14 NOTE — Assessment & Plan Note (Signed)
Improved. Pt applauded on succesful weight loss through lifestyle change, and encouraged to continue same. Weight loss goal set for the next several months.  

## 2011-10-01 ENCOUNTER — Other Ambulatory Visit: Payer: Self-pay | Admitting: Family Medicine

## 2011-10-21 ENCOUNTER — Other Ambulatory Visit: Payer: Self-pay | Admitting: Family Medicine

## 2011-10-21 DIAGNOSIS — Z139 Encounter for screening, unspecified: Secondary | ICD-10-CM

## 2011-10-23 ENCOUNTER — Inpatient Hospital Stay (HOSPITAL_COMMUNITY): Admission: RE | Admit: 2011-10-23 | Payer: Self-pay | Source: Ambulatory Visit

## 2011-11-03 ENCOUNTER — Encounter (HOSPITAL_COMMUNITY): Payer: Self-pay

## 2011-11-04 ENCOUNTER — Other Ambulatory Visit (HOSPITAL_COMMUNITY): Payer: Self-pay | Admitting: *Deleted

## 2011-11-06 ENCOUNTER — Encounter (HOSPITAL_COMMUNITY)
Admission: RE | Admit: 2011-11-06 | Discharge: 2011-11-06 | Disposition: A | Discharge status: Home / Self Care | Payer: PRIVATE HEALTH INSURANCE | Source: Ambulatory Visit | Attending: Nephrology | Admitting: Nephrology

## 2011-11-06 DIAGNOSIS — N189 Chronic kidney disease, unspecified: Secondary | ICD-10-CM | POA: Insufficient documentation

## 2011-11-06 DIAGNOSIS — D638 Anemia in other chronic diseases classified elsewhere: Secondary | ICD-10-CM | POA: Insufficient documentation

## 2011-11-06 LAB — POCT HEMOGLOBIN-HEMACUE: Hemoglobin: 8.3 g/dL — ABNORMAL LOW (ref 12.0–15.0)

## 2011-11-06 MED ORDER — EPOETIN ALFA 10000 UNIT/ML IJ SOLN
INTRAMUSCULAR | Status: AC
  Administered 2011-11-06: 10000 [IU] via SUBCUTANEOUS
  Filled 2011-11-06: qty 1

## 2011-11-06 MED ORDER — EPOETIN ALFA 10000 UNIT/ML IJ SOLN
10000.0000 [IU] | INTRAMUSCULAR | Status: DC
  Administered 2011-11-06: 10000 [IU] via SUBCUTANEOUS

## 2011-11-13 ENCOUNTER — Encounter (HOSPITAL_COMMUNITY)
Admission: RE | Admit: 2011-11-13 | Discharge: 2011-11-13 | Disposition: A | Discharge status: Home / Self Care | Payer: PRIVATE HEALTH INSURANCE | Source: Ambulatory Visit | Attending: Nephrology | Admitting: Nephrology

## 2011-11-13 LAB — POCT HEMOGLOBIN-HEMACUE: Hemoglobin: 8.3 g/dL — ABNORMAL LOW (ref 12.0–15.0)

## 2011-11-13 MED ORDER — EPOETIN ALFA 10000 UNIT/ML IJ SOLN
INTRAMUSCULAR | Status: AC
  Administered 2011-11-13: 10000 [IU] via SUBCUTANEOUS
  Filled 2011-11-13: qty 1

## 2011-11-13 MED ORDER — EPOETIN ALFA 10000 UNIT/ML IJ SOLN
10000.0000 [IU] | INTRAMUSCULAR | Status: DC
  Administered 2011-11-13: 10000 [IU] via SUBCUTANEOUS

## 2011-11-19 ENCOUNTER — Other Ambulatory Visit (HOSPITAL_COMMUNITY): Payer: Self-pay | Admitting: *Deleted

## 2011-11-20 ENCOUNTER — Encounter (HOSPITAL_COMMUNITY)
Admission: RE | Admit: 2011-11-20 | Discharge: 2011-11-20 | Disposition: A | Discharge status: Home / Self Care | Payer: PRIVATE HEALTH INSURANCE | Source: Ambulatory Visit | Attending: Nephrology | Admitting: Nephrology

## 2011-11-20 ENCOUNTER — Encounter (HOSPITAL_COMMUNITY): Payer: Self-pay

## 2011-11-20 LAB — CBC
HCT: 24.7 % — ABNORMAL LOW (ref 36.0–46.0)
Hemoglobin: 8.1 g/dL — ABNORMAL LOW (ref 12.0–15.0)
MCH: 26.6 pg (ref 26.0–34.0)
MCHC: 32.8 g/dL (ref 30.0–36.0)
MCV: 81 fL (ref 78.0–100.0)

## 2011-11-20 LAB — RENAL FUNCTION PANEL
BUN: 33 mg/dL — ABNORMAL HIGH (ref 6–23)
CO2: 18 mEq/L — ABNORMAL LOW (ref 19–32)
Calcium: 9.1 mg/dL (ref 8.4–10.5)
Creatinine, Ser: 3.38 mg/dL — ABNORMAL HIGH (ref 0.50–1.10)
Glucose, Bld: 100 mg/dL — ABNORMAL HIGH (ref 70–99)

## 2011-11-20 MED ORDER — EPOETIN ALFA 10000 UNIT/ML IJ SOLN
10000.0000 [IU] | INTRAMUSCULAR | Status: DC
  Administered 2011-11-20: 10000 [IU] via SUBCUTANEOUS
  Filled 2011-11-20: qty 1

## 2011-11-27 ENCOUNTER — Inpatient Hospital Stay (HOSPITAL_COMMUNITY): Admission: RE | Admit: 2011-11-27 | Payer: Self-pay | Source: Ambulatory Visit

## 2011-11-27 LAB — IRON AND TIBC
Iron: 25 ug/dL — ABNORMAL LOW (ref 42–135)
UIBC: 249 ug/dL (ref 125–400)

## 2011-11-28 ENCOUNTER — Encounter (HOSPITAL_COMMUNITY)
Admission: RE | Admit: 2011-11-28 | Discharge: 2011-11-28 | Disposition: A | Discharge status: Home / Self Care | Payer: Medicare Other | Source: Ambulatory Visit | Attending: Nephrology | Admitting: Nephrology

## 2011-11-28 DIAGNOSIS — D638 Anemia in other chronic diseases classified elsewhere: Secondary | ICD-10-CM | POA: Insufficient documentation

## 2011-11-28 DIAGNOSIS — N189 Chronic kidney disease, unspecified: Secondary | ICD-10-CM | POA: Insufficient documentation

## 2011-11-28 LAB — IRON AND TIBC
Iron: 17 ug/dL — ABNORMAL LOW (ref 42–135)
Saturation Ratios: 7 % — ABNORMAL LOW (ref 20–55)
TIBC: 246 ug/dL — ABNORMAL LOW (ref 250–470)
UIBC: 229 ug/dL (ref 125–400)

## 2011-11-28 MED ORDER — EPOETIN ALFA 10000 UNIT/ML IJ SOLN
INTRAMUSCULAR | Status: AC
  Filled 2011-11-28: qty 1

## 2011-11-28 MED ORDER — EPOETIN ALFA 10000 UNIT/ML IJ SOLN
10000.0000 [IU] | INTRAMUSCULAR | Status: DC
  Administered 2011-11-28: 10000 [IU] via SUBCUTANEOUS

## 2011-12-02 ENCOUNTER — Other Ambulatory Visit: Payer: Self-pay | Admitting: Family Medicine

## 2011-12-03 ENCOUNTER — Telehealth: Payer: Self-pay

## 2011-12-03 NOTE — Telephone Encounter (Signed)
Please advise 

## 2011-12-03 NOTE — Telephone Encounter (Signed)
[facility] called and said that she has been getting her Procrit injections at short stay at [facility] but daughter is wanting her to start getting them at [facility]. [facility] doesn't have privelages with [facility] so you would have to take responsibility for them. They would fax a form her for you to sign every 3 months and we would fax it back to them and they would send it to [facility]. But if there was a critical lab or something they would be calling you instead of [facility]. Is this something you would be willing to do so she wouldn't have to travel to [place] to do it? [facility] [phone]

## 2011-12-04 NOTE — Telephone Encounter (Signed)
Please call and get the form from Dr Marland Mcalpine office that I need to sign for her to be seen at the cancer center locally asap, I need to sign and send this today please  Please hand me the form when you receive it, thanks

## 2011-12-05 ENCOUNTER — Encounter (HOSPITAL_COMMUNITY): Payer: Self-pay

## 2011-12-05 NOTE — Telephone Encounter (Signed)
Called and left a message with Grover Canavan at [place] to fax it back to [phone]

## 2011-12-08 NOTE — Telephone Encounter (Signed)
I am sending a note to the renal Doctor. I have spoken with Dr Mariel Sleet, he states he will not be responsible for the procrit, I do not follow either. Ms Doering can get the injections at the specialty clinic in [place] where Dr Mariel Sleet is , ne[phrology can order, nephrology will need to be responsible for following the labs also. Pls let the nurse know and fax my note to nephrologist

## 2011-12-08 NOTE — Telephone Encounter (Signed)
I spoke with Grover Canavan and told them I was sending a letter to them and faxed to the fax number she provided and put a copy to be scanned

## 2011-12-11 ENCOUNTER — Other Ambulatory Visit (HOSPITAL_COMMUNITY): Payer: Self-pay | Admitting: *Deleted

## 2011-12-12 ENCOUNTER — Encounter (HOSPITAL_COMMUNITY)
Admission: RE | Admit: 2011-12-12 | Discharge: 2011-12-12 | Disposition: A | Discharge status: Home / Self Care | Payer: Medicare Other | Source: Ambulatory Visit | Attending: Nephrology | Admitting: Nephrology

## 2011-12-12 LAB — POCT HEMOGLOBIN-HEMACUE: Hemoglobin: 8.4 g/dL — ABNORMAL LOW (ref 12.0–15.0)

## 2011-12-12 MED ORDER — EPOETIN ALFA 10000 UNIT/ML IJ SOLN
10000.0000 [IU] | INTRAMUSCULAR | Status: DC
  Administered 2011-12-12: 10000 [IU] via SUBCUTANEOUS

## 2011-12-12 MED ORDER — EPOETIN ALFA 10000 UNIT/ML IJ SOLN
INTRAMUSCULAR | Status: AC
  Administered 2011-12-12: 10000 [IU] via SUBCUTANEOUS
  Filled 2011-12-12: qty 1

## 2011-12-12 MED ORDER — FERUMOXYTOL INJECTION 510 MG/17 ML
INTRAVENOUS | Status: AC
  Administered 2011-12-12: 510 mg via INTRAVENOUS
  Filled 2011-12-12: qty 17

## 2011-12-12 MED ORDER — SODIUM CHLORIDE 0.9 % IV SOLN
INTRAVENOUS | Status: DC
  Administered 2011-12-12: 10:00:00 via INTRAVENOUS

## 2011-12-12 MED ORDER — FERUMOXYTOL INJECTION 510 MG/17 ML
510.0000 mg | INTRAVENOUS | Status: DC
  Administered 2011-12-12: 510 mg via INTRAVENOUS

## 2011-12-17 ENCOUNTER — Encounter (HOSPITAL_COMMUNITY)
Admission: RE | Admit: 2011-12-17 | Discharge: 2011-12-17 | Disposition: A | Discharge status: Home / Self Care | Payer: Medicare Other | Source: Ambulatory Visit | Attending: Nephrology | Admitting: Nephrology

## 2011-12-17 LAB — RENAL FUNCTION PANEL
CO2: 19 mEq/L (ref 19–32)
GFR calc Af Amer: 12 mL/min — ABNORMAL LOW (ref >90)
GFR calc non Af Amer: 11 mL/min — ABNORMAL LOW (ref >90)
Glucose, Bld: 107 mg/dL — ABNORMAL HIGH (ref 70–99)
Phosphorus: 4.1 mg/dL (ref 2.3–4.6)
Potassium: 4.2 mEq/L (ref 3.5–5.1)
Sodium: 141 mEq/L (ref 135–145)

## 2011-12-17 LAB — POCT HEMOGLOBIN-HEMACUE: Hemoglobin: 8.6 g/dL — ABNORMAL LOW (ref 12.0–15.0)

## 2011-12-17 MED ORDER — EPOETIN ALFA 10000 UNIT/ML IJ SOLN
INTRAMUSCULAR | Status: AC
  Administered 2011-12-17: 10000 [IU]
  Filled 2011-12-17: qty 1

## 2011-12-17 MED ORDER — SODIUM CHLORIDE 0.9 % IV SOLN
INTRAVENOUS | Status: AC
  Administered 2011-12-17: 09:00:00 via INTRAVENOUS

## 2011-12-17 MED ORDER — FERUMOXYTOL INJECTION 510 MG/17 ML: 510.0000 mg | INTRAVENOUS | Status: DC

## 2011-12-17 MED ORDER — FERUMOXYTOL INJECTION 510 MG/17 ML
INTRAVENOUS | Status: AC
  Administered 2011-12-17: 09:00:00
  Filled 2011-12-17: qty 17

## 2011-12-17 MED ORDER — EPOETIN ALFA 10000 UNIT/ML IJ SOLN: 10000.0000 [IU] | INTRAMUSCULAR | Status: DC

## 2011-12-25 ENCOUNTER — Encounter: Payer: Self-pay | Admitting: Family Medicine

## 2011-12-25 ENCOUNTER — Ambulatory Visit (INDEPENDENT_AMBULATORY_CARE_PROVIDER_SITE_OTHER): Payer: Self-pay | Admitting: Family Medicine

## 2011-12-25 VITALS — BP 160/70 | HR 78 | Resp 18 | Ht 60.0 in | Wt 234.1 lb

## 2011-12-25 DIAGNOSIS — M949 Disorder of cartilage, unspecified: Secondary | ICD-10-CM

## 2011-12-25 DIAGNOSIS — E785 Hyperlipidemia, unspecified: Secondary | ICD-10-CM

## 2011-12-25 DIAGNOSIS — M899 Disorder of bone, unspecified: Secondary | ICD-10-CM

## 2011-12-25 DIAGNOSIS — M25569 Pain in unspecified knee: Secondary | ICD-10-CM

## 2011-12-25 DIAGNOSIS — N189 Chronic kidney disease, unspecified: Secondary | ICD-10-CM

## 2011-12-25 DIAGNOSIS — M25562 Pain in left knee: Secondary | ICD-10-CM

## 2011-12-25 DIAGNOSIS — E109 Type 1 diabetes mellitus without complications: Secondary | ICD-10-CM

## 2011-12-25 DIAGNOSIS — Z23 Encounter for immunization: Secondary | ICD-10-CM

## 2011-12-25 DIAGNOSIS — I10 Essential (primary) hypertension: Secondary | ICD-10-CM

## 2011-12-25 DIAGNOSIS — D649 Anemia, unspecified: Secondary | ICD-10-CM

## 2011-12-25 DIAGNOSIS — E119 Type 2 diabetes mellitus without complications: Secondary | ICD-10-CM

## 2011-12-25 DIAGNOSIS — E669 Obesity, unspecified: Secondary | ICD-10-CM

## 2011-12-25 DIAGNOSIS — R946 Abnormal results of thyroid function studies: Secondary | ICD-10-CM

## 2011-12-25 MED ORDER — METHYLPREDNISOLONE ACETATE 80 MG/ML IJ SUSP
80.0000 mg | Freq: Once | INTRAMUSCULAR | Status: AC
  Administered 2011-12-25: 80 mg via INTRAMUSCULAR

## 2011-12-25 NOTE — Progress Notes (Signed)
  Subjective:    Patient ID: Terri Kelly, female    DOB: 08/11/1935, 76 y.o.   MRN: [id number]  HPI The PT is here for follow up and re-evaluation of chronic medical conditions, medication management and review of any available recent lab and radiology data.  Preventive health is updated, specifically  Cancer screening and Immunization.   Questions or concerns regarding consultations or procedures which the PT has had in the interim are  addressed. The PT denies any adverse reactions to current medications since the last visit.  There are no new concerns.  C/o increased and uncontrolled knee pain   Has had her eye exam this year and has new glasses Denies polyuria, [polydipsia or hypoglycemic episodes   Review of Systems See HPI Denies recent fever or chills. Denies sinus pressure, nasal congestion, ear pain or sore throat. Denies chest congestion, productive cough or wheezing. Denies chest pains, palpitations and leg swelling Denies abdominal pain, nausea, vomiting,diarrhea or constipation.   Denies dysuria, frequency, hesitancy or incontinence. . Denies headaches, seizures, numbness, or tingling. Denies depression, anxiety or insomnia. Denies skin break down or rash.        Objective:   Physical Exam  Patient alert and oriented and in no cardiopulmonary distress.  HEENT: No facial asymmetry, EOMI, no sinus tenderness,  oropharynx pink and moist.  Neck decreased ROM no adenopathy.  Chest: Clear to auscultation bilaterally.  CVS: S1, S2 no murmurs, no S3.  ABD: Soft non tender. Bowel sounds normal.  Ext: No edema  MS: decreased  ROM spine, shoulders, hips and knees.  Skin: Intact, no ulcerations or rash noted.  Psych: Good eye contact, normal affect. Memory intact not anxious or depressed appearing.  CNS: CN 2-12 intact, power, tone and sensation normal throughout.       Assessment & Plan:

## 2011-12-25 NOTE — Patient Instructions (Addendum)
F/u in mid February, please call if you need me before  Fasting lipid, cmp,TSH,  HBA1C and vit D end December.   Depo medrol 80mg  IM today for left knee pain, and flu vaccine  I am thankful that you feel better, continue current meds, no changes at this time

## 2011-12-26 NOTE — Assessment & Plan Note (Signed)
Improved. Pt applauded on succesful weight loss through lifestyle change, and encouraged to continue same. Weight loss goal set for the next several months.  

## 2011-12-26 NOTE — Assessment & Plan Note (Signed)
Followed by nephrology. 

## 2011-12-26 NOTE — Assessment & Plan Note (Deleted)
Controlled, no change in medication Patient advised to reduce carb and sweets, commit to regular physical activity, take meds as prescribed, test blood as directed, and attempt to lose weight, to improve blood sugar control. Updated labs needed in December

## 2011-12-26 NOTE — Assessment & Plan Note (Signed)
Controlled, no change in medication Updated lab in December. Patient advised to reduce carb and sweets, commit to regular physical activity, take meds as prescribed, test blood as directed, and attempt to lose weight, to improve blood sugar control.

## 2011-12-26 NOTE — Assessment & Plan Note (Signed)
Increase and uncontrolled left knee pain depo medrol in office

## 2011-12-26 NOTE — Assessment & Plan Note (Signed)
Uncontrolled, no med change at this visit, being followed closely by nephrology will defer to nephrology at this time

## 2011-12-26 NOTE — Assessment & Plan Note (Signed)
Receiving parenteral treatment through nephrology

## 2011-12-30 ENCOUNTER — Other Ambulatory Visit (HOSPITAL_COMMUNITY): Payer: Self-pay | Admitting: *Deleted

## 2011-12-31 ENCOUNTER — Encounter (HOSPITAL_COMMUNITY)
Admission: RE | Admit: 2011-12-31 | Discharge: 2011-12-31 | Disposition: A | Discharge status: Home / Self Care | Payer: Medicare Other | Source: Ambulatory Visit | Attending: Nephrology | Admitting: Nephrology

## 2011-12-31 DIAGNOSIS — N189 Chronic kidney disease, unspecified: Secondary | ICD-10-CM | POA: Insufficient documentation

## 2011-12-31 DIAGNOSIS — D638 Anemia in other chronic diseases classified elsewhere: Secondary | ICD-10-CM | POA: Insufficient documentation

## 2011-12-31 LAB — RENAL FUNCTION PANEL
Albumin: 4.1 g/dL (ref 3.5–5.2)
Chloride: 105 mEq/L (ref 96–112)
GFR calc Af Amer: 13 mL/min — ABNORMAL LOW (ref >90)
GFR calc non Af Amer: 11 mL/min — ABNORMAL LOW (ref >90)
Phosphorus: 3.8 mg/dL (ref 2.3–4.6)
Potassium: 4.1 mEq/L (ref 3.5–5.1)
Sodium: 139 mEq/L (ref 135–145)

## 2011-12-31 LAB — IRON AND TIBC
Saturation Ratios: 29 % (ref 20–55)
UIBC: 168 ug/dL (ref 125–400)

## 2011-12-31 LAB — POCT HEMOGLOBIN-HEMACUE: Hemoglobin: 9.6 g/dL — ABNORMAL LOW (ref 12.0–15.0)

## 2011-12-31 MED ORDER — EPOETIN ALFA 10000 UNIT/ML IJ SOLN
10000.0000 [IU] | INTRAMUSCULAR | Status: DC
  Administered 2011-12-31: 10000 [IU] via SUBCUTANEOUS

## 2011-12-31 MED ORDER — EPOETIN ALFA 10000 UNIT/ML IJ SOLN
INTRAMUSCULAR | Status: AC
  Administered 2011-12-31: 10000 [IU] via SUBCUTANEOUS
  Filled 2011-12-31: qty 1

## 2012-01-01 LAB — FERRITIN: Ferritin: 703 ng/mL — ABNORMAL HIGH (ref 10–291)

## 2012-01-12 ENCOUNTER — Encounter (HOSPITAL_COMMUNITY): Payer: Self-pay

## 2012-01-15 ENCOUNTER — Encounter (HOSPITAL_COMMUNITY)
Admission: RE | Admit: 2012-01-15 | Discharge: 2012-01-15 | Disposition: A | Discharge status: Home / Self Care | Payer: Medicare Other | Source: Ambulatory Visit | Attending: Nephrology | Admitting: Nephrology

## 2012-01-15 LAB — POCT HEMOGLOBIN-HEMACUE: Hemoglobin: 9.1 g/dL — ABNORMAL LOW (ref 12.0–15.0)

## 2012-01-15 MED ORDER — EPOETIN ALFA 10000 UNIT/ML IJ SOLN
10000.0000 [IU] | INTRAMUSCULAR | Status: DC
  Administered 2012-01-15: 10000 [IU] via SUBCUTANEOUS

## 2012-01-15 MED ORDER — EPOETIN ALFA 10000 UNIT/ML IJ SOLN
INTRAMUSCULAR | Status: AC
  Filled 2012-01-15: qty 1

## 2012-01-27 ENCOUNTER — Other Ambulatory Visit (HOSPITAL_COMMUNITY): Payer: Self-pay | Admitting: *Deleted

## 2012-01-28 ENCOUNTER — Encounter (HOSPITAL_COMMUNITY)
Admission: RE | Admit: 2012-01-28 | Discharge: 2012-01-28 | Disposition: A | Discharge status: Home / Self Care | Payer: Medicare Other | Source: Ambulatory Visit | Attending: Nephrology | Admitting: Nephrology

## 2012-01-28 DIAGNOSIS — N189 Chronic kidney disease, unspecified: Secondary | ICD-10-CM | POA: Insufficient documentation

## 2012-01-28 DIAGNOSIS — D638 Anemia in other chronic diseases classified elsewhere: Secondary | ICD-10-CM | POA: Insufficient documentation

## 2012-01-28 LAB — RENAL FUNCTION PANEL
CO2: 16 mEq/L — ABNORMAL LOW (ref 19–32)
Chloride: 106 mEq/L (ref 96–112)
GFR calc Af Amer: 8 mL/min — ABNORMAL LOW (ref >90)
Glucose, Bld: 83 mg/dL (ref 70–99)
Phosphorus: 5.5 mg/dL — ABNORMAL HIGH (ref 2.3–4.6)
Potassium: 4.4 mEq/L (ref 3.5–5.1)
Sodium: 141 mEq/L (ref 135–145)

## 2012-01-28 LAB — POCT HEMOGLOBIN-HEMACUE: Hemoglobin: 9.5 g/dL — ABNORMAL LOW (ref 12.0–15.0)

## 2012-01-28 LAB — IRON AND TIBC: Saturation Ratios: 29 % (ref 20–55)

## 2012-01-28 MED ORDER — EPOETIN ALFA 20000 UNIT/ML IJ SOLN
INTRAMUSCULAR | Status: AC
  Filled 2012-01-28: qty 1

## 2012-01-28 MED ORDER — EPOETIN ALFA 20000 UNIT/ML IJ SOLN
15000.0000 [IU] | INTRAMUSCULAR | Status: DC
  Administered 2012-01-28: 15000 [IU] via SUBCUTANEOUS

## 2012-01-29 ENCOUNTER — Encounter (HOSPITAL_COMMUNITY): Payer: Self-pay

## 2012-02-03 ENCOUNTER — Encounter (HOSPITAL_COMMUNITY)
Admission: RE | Admit: 2012-02-03 | Discharge: 2012-02-03 | Disposition: A | Discharge status: Home / Self Care | Payer: Medicare Other | Source: Ambulatory Visit | Attending: Nephrology | Admitting: Nephrology

## 2012-02-03 LAB — POCT HEMOGLOBIN-HEMACUE: Hemoglobin: 9.1 g/dL — ABNORMAL LOW (ref 12.0–15.0)

## 2012-02-03 MED ORDER — EPOETIN ALFA 20000 UNIT/ML IJ SOLN
INTRAMUSCULAR | Status: AC
  Filled 2012-02-03: qty 1

## 2012-02-03 MED ORDER — EPOETIN ALFA 20000 UNIT/ML IJ SOLN
15000.0000 [IU] | INTRAMUSCULAR | Status: DC
  Administered 2012-02-03: 15000 [IU] via SUBCUTANEOUS

## 2012-02-10 ENCOUNTER — Encounter (HOSPITAL_COMMUNITY)
Admission: RE | Admit: 2012-02-10 | Discharge: 2012-02-10 | Disposition: A | Discharge status: Home / Self Care | Payer: Medicare Other | Source: Ambulatory Visit | Attending: Nephrology | Admitting: Nephrology

## 2012-02-10 MED ORDER — EPOETIN ALFA 20000 UNIT/ML IJ SOLN
INTRAMUSCULAR | Status: AC
  Administered 2012-02-10: 15000 [IU] via SUBCUTANEOUS
  Filled 2012-02-10: qty 1

## 2012-02-10 MED ORDER — EPOETIN ALFA 20000 UNIT/ML IJ SOLN
15000.0000 [IU] | INTRAMUSCULAR | Status: DC
  Administered 2012-02-10: 15000 [IU] via SUBCUTANEOUS

## 2012-02-11 MED FILL — Epoetin Alfa Inj 20000 Unit/ML: INTRAMUSCULAR | Qty: 1 | Status: AC

## 2012-02-12 ENCOUNTER — Telehealth: Payer: Self-pay | Admitting: Family Medicine

## 2012-02-12 NOTE — Telephone Encounter (Signed)
Should this patient be on potassium?

## 2012-02-12 NOTE — Telephone Encounter (Signed)
None on record, and I doubt she needs it she has chronic kidney disease, please verify with pharmacy if and  when last she did fill any rx for potassium let me know if med list needs updating and do this if she has been taking potassium and refill. Please direct any further questions you may have to me

## 2012-02-13 ENCOUNTER — Other Ambulatory Visit: Payer: Self-pay

## 2012-02-13 MED ORDER — POTASSIUM CHLORIDE CRYS ER 20 MEQ PO TBCR: 20.0000 meq | EXTENDED_RELEASE_TABLET | Freq: Every day | ORAL | Status: DC

## 2012-02-16 NOTE — Telephone Encounter (Signed)
Med was refilled.  Patient states that she only takes as needed for cramps.

## 2012-02-17 ENCOUNTER — Encounter (HOSPITAL_COMMUNITY): Payer: Self-pay

## 2012-02-23 NOTE — Telephone Encounter (Signed)
Explain, dangerous for a renal pt to take potassium, may get too high, so I will not refill, she may discuss further with her kidney Doc. Potassium only helps cramps if potassium is low, and hers is not

## 2012-02-23 NOTE — Telephone Encounter (Signed)
Patient aware.

## 2012-02-25 ENCOUNTER — Other Ambulatory Visit: Payer: Self-pay

## 2012-02-25 MED ORDER — OMEPRAZOLE 20 MG PO CPDR: 20.0000 mg | DELAYED_RELEASE_CAPSULE | Freq: Two times a day (BID) | ORAL | Status: DC

## 2012-03-08 ENCOUNTER — Telehealth: Payer: Self-pay | Admitting: Family Medicine

## 2012-03-08 DIAGNOSIS — N189 Chronic kidney disease, unspecified: Secondary | ICD-10-CM

## 2012-03-08 MED ORDER — HYDROCODONE-ACETAMINOPHEN 7.5-750 MG PO TABS: 1.0000 | ORAL_TABLET | Freq: Three times a day (TID) | ORAL | Status: DC

## 2012-03-08 NOTE — Telephone Encounter (Signed)
Will fax.

## 2012-03-08 NOTE — Telephone Encounter (Signed)
pls let pt know Frestyle and freestyle lite meters are recalled. Need new meter if she has this still. Script written and in your station

## 2012-03-09 NOTE — Telephone Encounter (Signed)
Patient aware and rx faxed

## 2012-03-13 LAB — TSH: TSH: 5.569 u[IU]/mL — ABNORMAL HIGH (ref 0.350–4.500)

## 2012-03-14 LAB — COMPREHENSIVE METABOLIC PANEL
ALT: 11 U/L (ref 0–35)
AST: 14 U/L (ref 0–37)
CO2: 17 mEq/L — ABNORMAL LOW (ref 19–32)
Calcium: 8.1 mg/dL — ABNORMAL LOW (ref 8.4–10.5)
Chloride: 111 mEq/L (ref 96–112)
Creat: 6.76 mg/dL — ABNORMAL HIGH (ref 0.50–1.10)
Potassium: 4.6 mEq/L (ref 3.5–5.3)
Sodium: 139 mEq/L (ref 135–145)
Total Protein: 6.4 g/dL (ref 6.0–8.3)

## 2012-03-14 LAB — LIPID PANEL
HDL: 28 mg/dL — ABNORMAL LOW (ref >39)
LDL Cholesterol: 66 mg/dL (ref 0–99)

## 2012-03-14 LAB — HEMOGLOBIN A1C: Hgb A1c MFr Bld: 5.5 % (ref <5.7)

## 2012-03-19 NOTE — Telephone Encounter (Signed)
Stat labs ordered and faxed to the lab. Pt aware

## 2012-03-19 NOTE — Addendum Note (Signed)
Addended by: Abner Greenspan on: 03/19/2012 08:23 AM   Modules accepted: Orders

## 2012-03-22 LAB — CBC WITH DIFFERENTIAL/PLATELET
Basophils Absolute: 0.1 10*3/uL (ref 0.0–0.1)
Eosinophils Relative: 2 % (ref 0–5)
Lymphs Abs: 2 10*3/uL (ref 0.7–4.0)
MCH: 26.8 pg (ref 26.0–34.0)
MCHC: 32.8 g/dL (ref 30.0–36.0)
MCV: 81.8 fL (ref 78.0–100.0)
Neutrophils Relative %: 67 % (ref 43–77)
Platelets: 264 10*3/uL (ref 150–400)
RBC: 2.91 MIL/uL — ABNORMAL LOW (ref 3.87–5.11)
RDW: 15 % (ref 11.5–15.5)

## 2012-03-23 LAB — BASIC METABOLIC PANEL
BUN: 57 mg/dL — ABNORMAL HIGH (ref 6–23)
Chloride: 104 mEq/L (ref 96–112)
Glucose, Bld: 102 mg/dL — ABNORMAL HIGH (ref 70–99)
Potassium: 4.3 mEq/L (ref 3.5–5.3)
Sodium: 137 mEq/L (ref 135–145)

## 2012-03-25 ENCOUNTER — Ambulatory Visit (INDEPENDENT_AMBULATORY_CARE_PROVIDER_SITE_OTHER): Payer: Medicare Other | Admitting: Family Medicine

## 2012-03-25 ENCOUNTER — Encounter: Payer: Self-pay | Admitting: Family Medicine

## 2012-03-25 VITALS — BP 178/70 | HR 94 | Resp 16 | Wt 210.0 lb

## 2012-03-25 DIAGNOSIS — R7302 Impaired glucose tolerance (oral): Secondary | ICD-10-CM

## 2012-03-25 DIAGNOSIS — N186 End stage renal disease: Secondary | ICD-10-CM

## 2012-03-25 DIAGNOSIS — R7309 Other abnormal glucose: Secondary | ICD-10-CM

## 2012-03-25 DIAGNOSIS — N2581 Secondary hyperparathyroidism of renal origin: Secondary | ICD-10-CM

## 2012-03-25 DIAGNOSIS — D649 Anemia, unspecified: Secondary | ICD-10-CM

## 2012-03-25 NOTE — Progress Notes (Signed)
  Subjective:    Patient ID: Terri Kelly, female    DOB: [birth year], 77 y.o.   MRN: [id number]  HPI Pt i n with 2 of her daughters to review most recent labs and for consultation re her chronic kidney disease, which has significantly worsened. I discussed he case with the nephrologist who has been following her before he visit. Essentially, he stated he had already discussed at length with patient and her family that her options were dialysis or hospice, at the last visit, and Kismet has maintained that she does not want dialysis. Today, Ms. Dooly reiterates this position, is at peace wit her decision, and is quite as firm about it. Her daughters who are present concur that this what their mother wishes and they will support her wishes. The family when specifically approached re hospice involvement is not willing at this time, but led by [facility], state that at some point this will be considered a good option. Patient's hemoglobin is now significantly decreased also. She had been getting treatment parenterally i [place] for [place], but the commute was difficult , now requests establishment with hematology for this , will try to coordinate.I did verbalize that moving forward, lab draws to test kidney function and status would not be on my plan, based on patient decision for no intervention. They agreed. I will certainly follow up this visit with contact with her nephrologist who has also provided his cell # so that we can  Best coordinate care for this pt. The patient and family had no questions, and did not seem overly concerned at the discussion, obviously, they have heard this before, it is noted however that her renal failure has dramatically worsened. I di state that if there was a change in the course decided on, they should not hesitate to let me know, however I fully respected current decision I will spk with both nephrologist and hematologist   Review of Systems No significant complaints,  currently mourning the sudden death of a grandson win his early 35's due to an aneurysm    Objective:   Physical Exam  Pleasant elderly female sitting up in no visible cardiopulmonary distress. Pt is of sound mind and competent to make her own health care decisions. She is accompanied by 2 daughters , who concur with mother's wishes and state this is the position she has maintained and has been discussed in the past Mild supraorbital edema and pedal edema noted. Mucosa pale      Assessment & Plan:

## 2012-03-25 NOTE — Patient Instructions (Addendum)
F/u in 4 month, please call if you need me before.  You will be referred to the hematology clinic here in  for management of your anemia.  I will be in touch with Dr Hyman Hopes, and let him know that you do not want dialysis, andthat for right now you do not want hospice , but that you will let me know when ready.  Please accept my condolence on your loss

## 2012-03-28 ENCOUNTER — Telehealth: Payer: Self-pay | Admitting: Family Medicine

## 2012-03-28 DIAGNOSIS — E669 Obesity, unspecified: Secondary | ICD-10-CM

## 2012-03-28 DIAGNOSIS — R946 Abnormal results of thyroid function studies: Secondary | ICD-10-CM

## 2012-03-28 DIAGNOSIS — E785 Hyperlipidemia, unspecified: Secondary | ICD-10-CM

## 2012-03-28 DIAGNOSIS — N2581 Secondary hyperparathyroidism of renal origin: Secondary | ICD-10-CM | POA: Insufficient documentation

## 2012-03-28 DIAGNOSIS — R7301 Impaired fasting glucose: Secondary | ICD-10-CM

## 2012-03-28 NOTE — Telephone Encounter (Signed)
Please contact Terri Kelly and explain to her and also to one of her daughters, that she needs to stop glipizide and blood sugar testing based on a normal hBa1c in October, she is no longer diagnosed as being diabetic, and test supplies will no longer be covered, also home testing not indicated. I will check her blood sugar avg every 4 months next due is Feb 4 or after , pls order HBA1C, hepatic and TSH alll non fasting and ensure they understand. Pharmacy needs note sent to discontinue test supplies and glipizide also PLEASE

## 2012-03-28 NOTE — Assessment & Plan Note (Signed)
Significant deterioration in renal function, needs dialysis, however elects not to pursue this. Hospice is in the horizon, bu family not interested at this time.

## 2012-03-28 NOTE — Assessment & Plan Note (Signed)
Most recent HBa1C 5.5 in October, pt on glipizide, will d/c same due to poor renal function and lvel, rept HBa1C in Feb along with hepatic  panel and tSH, pt and family member will need to be contacted in this regard

## 2012-03-28 NOTE — Assessment & Plan Note (Addendum)
Worsened significantly, main problem is CKD, will see if pt cn get treatment through local hematology clinic, pt and the family are interested in this, she has responded in the past but driving to [place] found too difficult Has had procrit and IV iron in the past with benefit

## 2012-03-29 NOTE — Addendum Note (Signed)
Addended by: Abner Greenspan on: 03/29/2012 09:29 AM   Modules accepted: Orders

## 2012-03-30 NOTE — Telephone Encounter (Signed)
Pt aware- letter sent to [facility]- and lab order mailed to pt

## 2012-03-30 NOTE — Addendum Note (Signed)
Addended by: Abner Greenspan on: 03/30/2012 08:54 AM   Modules accepted: Orders

## 2012-04-09 ENCOUNTER — Encounter (HOSPITAL_COMMUNITY): Payer: Self-pay | Admitting: Oncology

## 2012-04-09 ENCOUNTER — Encounter (HOSPITAL_COMMUNITY): Payer: Medicare Other | Attending: Nephrology | Admitting: Oncology

## 2012-04-09 VITALS — BP 173/71 | HR 85 | Temp 97.7°F | Resp 20 | Wt 231.5 lb

## 2012-04-09 DIAGNOSIS — D649 Anemia, unspecified: Secondary | ICD-10-CM | POA: Insufficient documentation

## 2012-04-09 DIAGNOSIS — D631 Anemia in chronic kidney disease: Secondary | ICD-10-CM

## 2012-04-09 DIAGNOSIS — N186 End stage renal disease: Secondary | ICD-10-CM

## 2012-04-09 HISTORY — DX: Anemia, unspecified: D64.9

## 2012-04-09 LAB — CBC WITH DIFFERENTIAL/PLATELET
Basophils Absolute: 0.1 10*3/uL (ref 0.0–0.1)
Eosinophils Relative: 2 % (ref 0–5)
HCT: 23.5 % — ABNORMAL LOW (ref 36.0–46.0)
Hemoglobin: 7.6 g/dL — ABNORMAL LOW (ref 12.0–15.0)
Lymphocytes Relative: 18 % (ref 12–46)
Lymphs Abs: 2 10*3/uL (ref 0.7–4.0)
MCV: 82.5 fL (ref 78.0–100.0)
Monocytes Absolute: 0.7 10*3/uL (ref 0.1–1.0)
Monocytes Relative: 7 % (ref 3–12)
RDW: 14.5 % (ref 11.5–15.5)
WBC: 11.1 10*3/uL — ABNORMAL HIGH (ref 4.0–10.5)

## 2012-04-09 LAB — COMPREHENSIVE METABOLIC PANEL
BUN: 62 mg/dL — ABNORMAL HIGH (ref 6–23)
CO2: 17 mEq/L — ABNORMAL LOW (ref 19–32)
Calcium: 8.2 mg/dL — ABNORMAL LOW (ref 8.4–10.5)
Chloride: 105 mEq/L (ref 96–112)
Creatinine, Ser: 7.34 mg/dL — ABNORMAL HIGH (ref 0.50–1.10)
GFR calc Af Amer: 6 mL/min — ABNORMAL LOW (ref >90)
GFR calc non Af Amer: 5 mL/min — ABNORMAL LOW (ref >90)
Glucose, Bld: 120 mg/dL — ABNORMAL HIGH (ref 70–99)
Total Bilirubin: 0.1 mg/dL — ABNORMAL LOW (ref 0.3–1.2)

## 2012-04-09 LAB — IRON AND TIBC: Iron: 65 ug/dL (ref 42–135)

## 2012-04-09 LAB — FOLATE: Folate: 6.4 ng/mL

## 2012-04-09 LAB — VITAMIN B12: Vitamin B-12: 746 pg/mL (ref 211–911)

## 2012-04-09 MED ORDER — EPOETIN ALFA 40000 UNIT/ML IJ SOLN
INTRAMUSCULAR | Status: AC
  Filled 2012-04-09: qty 1

## 2012-04-09 MED ORDER — EPOETIN ALFA 40000 UNIT/ML IJ SOLN
40000.0000 [IU] | Freq: Once | INTRAMUSCULAR | Status: AC
  Administered 2012-04-09: 40000 [IU] via SUBCUTANEOUS

## 2012-04-09 NOTE — Patient Instructions (Addendum)
[facility] Discharge Instructions  RECOMMENDATIONS MADE BY THE CONSULTANT AND ANY TEST RESULTS WILL BE SENT TO YOUR REFERRING PHYSICIAN.  EXAM FINDINGS BY THE PHYSICIAN TODAY AND SIGNS OR SYMPTOMS TO REPORT TO CLINIC OR PRIMARY PHYSICIAN: Exam and discussion by Dellis Anes, PA.  We will start you on procrit injections to see if we can get your hemoglobin up.  We will check your bloodwork prior to your injections each week.  MEDICATIONS PRESCRIBED:  none  INSTRUCTIONS GIVEN AND DISCUSSED: Report increased fatigue or increased shortness of breath.  SPECIAL INSTRUCTIONS/FOLLOW-UP:  Blood work and procrit weekly and for follow-up in 8 weeks.   Thank you for choosing [facility] to provide your oncology and hematology care.  To afford each patient quality time with our providers, please arrive at least 15 minutes before your scheduled appointment time.  With your help, our goal is to use those 15 minutes to complete the necessary work-up to ensure our physicians have the information they need to help with your evaluation and healthcare recommendations.    Effective January 1st, 2014, we ask that you re-schedule your appointment with our physicians should you arrive 10 or more minutes late for your appointment.  We strive to give you quality time with our providers, and arriving late affects you and other patients whose appointments are after yours.    Again, thank you for choosing [facility].  Our hope is that these requests will decrease the amount of time that you wait before being seen by our physicians.       _____________________________________________________________  Should you have questions after your visit to [facility], please contact our office at [phone] between the hours of 8:30 a.m. and 5:00 p.m.  Voicemails left after 4:30 p.m. will not be returned until the following business day.  For prescription refill  requests, have your pharmacy contact our office with your prescription refill request.

## 2012-04-09 NOTE — Progress Notes (Signed)
Terri Kelly presented for labwork. Labs per MD order drawn via Peripheral Line 23 gauge needle inserted in right AC  Good blood return present. Procedure without incident.  Needle removed intact. Patient tolerated procedure well.  Terri Kelly presents today for injection per MD orders. Procrit 40,000 administered SQ in right Abdomen. Administration without incident. Patient tolerated well.

## 2012-04-09 NOTE — Addendum Note (Signed)
Addended by: Ellouise Newer on: 04/09/2012 07:28 PM   Modules accepted: Level of Service

## 2012-04-09 NOTE — Progress Notes (Signed)
[facility] NEW PATIENT EVALUATION   Name: Terri Kelly Date: 04/09/2012 MRN: [id number] DOB: Jun 23, 1935    CC: [name], MD     DIAGNOSIS: The primary encounter diagnosis was ANEMIA. A diagnosis of Normocytic anemia was also pertinent to this visit.   HISTORY OF PRESENT ILLNESS:Terri Kelly is a 77 y.o. 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   She is accompanied by her granddaughter, Therese Sarah.  Terri Kelly was given 10,000-15,000 units of Procrit weekly up until 02/10/2012.  She also received 510 mg of Feraheme IV on 12/12/2011.  Terri Kelly has end-stage renal failure.  She has been following nephrology since August 2013.  She has not been given a reason for her quick decline but she reports that it runs in her family. She has been following nephrology, Dr. Conseco, who has offered her dialysis.  Terri Kelly has refused this treatment and intervention.  The topic of Hospice has been broached between the patient-nephrologist-PCP.  At this time, [facility] has refused hospice referral.  Will defer future discussion regarding hospice with the PCP and nephrologist.    Terri Kelly reports that she feels great, but is here to get some blood.  So we spent some time discussing her symptoms.  She does admit to SOB and fatigue on exertion only.  She denies dyspnea.  She reports that her SOB and fatigue is stable over the past 6 months despite her recent drop in Hgb to 7.8 in December 2013.    She denies any headaches, dizziness, double vision, fevers, chills, night sweats, nausea, vomiting, diarrhea, constipation, blood in stool, black tarry stool, hematuria, abdominal pain, chest pain, heart palpitations, urinary pain/bruning/frequenct.  She was offered a blood transfusion for her Hgb below 8, but she  has declined.  She would rather lab work be performed to evaluate her anemia.  She would prefer to avoid blood transfusions particularly since she feels so well.  Chart is reviewed.  Labs are appreciated and discussed with the patient.  Her most recent Hgb was :   Hemoglobin & Hematocrit     Component Value Date/Time   HGB 7.8* 03/19/2012 0821   HCT 23.8* 03/19/2012 0821    I do not have any nephrology notes available at this time.    Hematologic ROS questioning is negative.  She does not wish to even discuss dialysis.   FAMILY HISTORY: family history includes Heart disease in her father; Hypertension in her brothers and sister; Kidney disease in her father; and Kidney failure in her brother and sister.  Patient has a strong family history for renal failure.  Her mother passed away at the age of 61 from old age, she also had heart disease.  Her father passed in his 64's from renal failure.  She has 1 living brother out of 7 siblings.  He is 52 and he is doing well. She had a brother who passed in his 68's from renal failure.,  She had a brother who passed at the age of 13 from MI.  All other sisters passed from "old age" or MI, one in her 54's (MI), 42's (MI), 53 (stroke), etc.  She has 6 children, none have renal issues and are alive and well.    PAST MEDICAL HISTORY:  has a past medical history of Hyperlipidemia; Obesity; Hypertension; Diabetes mellitus type  1; Back pain; Sleep apnea (2009); Diastolic dysfunction (Nov 2011); Chronic kidney disease; Normocytic anemia (04/09/2012); Allergy; and Arthritis.      CURRENT MEDICATIONS:  See CHL but of note, ASA 81 mg daily   SOCIAL HISTORY:  Patient is from [place], [place].  She completed 11th grade of high school.  She worked at [employer].  She has been married for 56 years.  She lives with her husband and two of her daughters.  She denies EtOH abuse.  She denies illicit drug abuse.  She admits to 1/2 pp tobacco abuse history x 20 years,  quitting 35-40 years ago.  ALLERGIES: Ace inhibitors and Angiotensin receptor blockers both causes pruritis   LABORATORY DATA:   CBC    Component Value Date/Time   WBC 9.2 03/19/2012 0821   RBC 2.91* 03/19/2012 0821   HGB 7.8* 03/19/2012 0821   HCT 23.8* 03/19/2012 0821   PLT 264 03/19/2012 0821   MCV 81.8 03/19/2012 0821   MCH 26.8 03/19/2012 0821   MCHC 32.8 03/19/2012 0821   RDW 15.0 03/19/2012 0821   LYMPHSABS 2.0 03/19/2012 0821   MONOABS 0.7 03/19/2012 0821   EOSABS 0.2 03/19/2012 0821   BASOSABS 0.1 03/19/2012 0821      Chemistry      Component Value Date/Time   NA 137 03/19/2012 0821   K 4.3 03/19/2012 0821   CL 104 03/19/2012 0821   CO2 16* 03/19/2012 0821   BUN 57* 03/19/2012 0821   CREATININE 7.04* 03/19/2012 0821   CREATININE 5.49* 01/28/2012 1028      Component Value Date/Time   CALCIUM 7.4* 03/19/2012 0821   CALCIUM 8.7 11/20/2011 1254   ALKPHOS 79 12/25/2011 1551   AST 14 12/25/2011 1551   ALT 11 12/25/2011 1551   BILITOT 0.3 12/25/2011 1551     Lab Results  Component Value Date   IRON 65 01/28/2012   TIBC 226* 01/28/2012   FERRITIN 555* 01/28/2012   Lab Results  Component Value Date   FOLATE 14.6 04/22/2011   Lab Results  Component Value Date   VITAMINB12 403 04/22/2011      PHYSICAL EXAM:  weight is 231 lb 8 oz (105.008 kg). Her oral temperature is 97.7 F (36.5 C). Her blood pressure is 173/71 and her pulse is 85. Her respiration is 20.  General appearance: alert, cooperative, appears stated age, no distress, moderately obese and accompanied by Barnabas Harries who works at [employer]: Normocephalic, without obvious abnormality, atraumatic Neck: no adenopathy, no carotid bruit, no JVD, supple, symmetrical, trachea midline and thyroid not enlarged, symmetric, no tenderness/mass/nodules Lymph nodes: Cervical, supraclavicular, and axillary nodes normal. Resp: clear to auscultation bilaterally and normal percussion  bilaterally Back: symmetric, no curvature. ROM normal. No CVA tenderness. Cardio: regular rate and rhythm, S1, S2 normal, no murmur, click, rub or gallop GI: normal findings: bowel sounds normal, no masses palpable and soft, non-tender Extremities: edema B/L 2+ pitting edema pretibially and inferiorly Neurologic: Grossly normal     IMPRESSION:  1. Normocytic anemia 2. End-stage renal failure, followed by Dr. Hyman Hopes (nephrologist).  Patient has declined dialysis  3. Back pain 4. Hyperlipidemia 5. Hyperparathyroidism secondary to renal disease  6. HTN 7. Obesity    PLAN:  1. I personally reviewed and went over laboratory results with the patient. 2. I personally reviewed and went over radiographic studies with the patient. 3. Lab work today: CBC diff, CMET, Anemia Panel 4. Will start Procrit 40,000 units weekly and will adjust as  needed per lab results. Procrit supportive therapy plan built 5. If Hgb does not improve, will administer Feraheme 510 mg IV so the patient has free iron. 6. Goal of therapy is to get Hgb around 9 g/dL which is likely the best we can do in light of her renal failure.   7. Return in 8 weeks for follow-up.   Patient knows to call the clinic with any questions or concerns.    Patient and plan discussed with Dr. Glenford Peers and he is in agreement with the aforementioned.  Patient seen by Dr. Mariel Sleet as well.  Elie Gragert

## 2012-04-12 ENCOUNTER — Other Ambulatory Visit (HOSPITAL_COMMUNITY): Payer: Self-pay | Admitting: Oncology

## 2012-04-12 DIAGNOSIS — E538 Deficiency of other specified B group vitamins: Secondary | ICD-10-CM

## 2012-04-12 MED ORDER — FOLIC ACID 1 MG PO TABS: 1.0000 mg | ORAL_TABLET | Freq: Every day | ORAL | Status: DC

## 2012-04-16 ENCOUNTER — Ambulatory Visit (HOSPITAL_COMMUNITY): Payer: Medicare Other

## 2012-04-23 ENCOUNTER — Encounter (HOSPITAL_BASED_OUTPATIENT_CLINIC_OR_DEPARTMENT_OTHER): Payer: Medicare Other

## 2012-04-23 VITALS — BP 175/80 | HR 92

## 2012-04-23 DIAGNOSIS — N186 End stage renal disease: Secondary | ICD-10-CM

## 2012-04-23 DIAGNOSIS — D649 Anemia, unspecified: Secondary | ICD-10-CM

## 2012-04-23 LAB — CBC
MCH: 27.2 pg (ref 26.0–34.0)
MCHC: 32.3 g/dL (ref 30.0–36.0)
MCV: 84.4 fL (ref 78.0–100.0)
Platelets: 331 10*3/uL (ref 150–400)
RBC: 3.01 MIL/uL — ABNORMAL LOW (ref 3.87–5.11)
RDW: 16.1 % — ABNORMAL HIGH (ref 11.5–15.5)

## 2012-04-23 MED ORDER — EPOETIN ALFA 40000 UNIT/ML IJ SOLN
40000.0000 [IU] | Freq: Once | INTRAMUSCULAR | Status: AC
  Administered 2012-04-23: 40000 [IU] via SUBCUTANEOUS

## 2012-04-23 NOTE — Progress Notes (Signed)
Terri Kelly presents today for injection per MD orders. Procrit 40981 units administered SQ in right Abdomen. Administration without incident. Patient tolerated well. Terri Kelly presented for labwork. Labs per MD order drawn via Peripheral Line 25 gauge needle inserted in rt ac.  Good blood return present. Procedure without incident.  Needle removed intact. Patient tolerated procedure well.

## 2012-04-30 ENCOUNTER — Encounter (HOSPITAL_COMMUNITY): Payer: Medicare Other | Attending: Nephrology

## 2012-04-30 VITALS — BP 148/71 | HR 70 | Resp 18

## 2012-04-30 DIAGNOSIS — D649 Anemia, unspecified: Secondary | ICD-10-CM | POA: Insufficient documentation

## 2012-04-30 DIAGNOSIS — D631 Anemia in chronic kidney disease: Secondary | ICD-10-CM

## 2012-04-30 DIAGNOSIS — N186 End stage renal disease: Secondary | ICD-10-CM

## 2012-04-30 LAB — CBC
HCT: 26.3 % — ABNORMAL LOW (ref 36.0–46.0)
MCHC: 31.9 g/dL (ref 30.0–36.0)
Platelets: 399 10*3/uL (ref 150–400)
RDW: 17.7 % — ABNORMAL HIGH (ref 11.5–15.5)

## 2012-04-30 MED ORDER — EPOETIN ALFA 40000 UNIT/ML IJ SOLN
40000.0000 [IU] | Freq: Once | INTRAMUSCULAR | Status: AC
  Administered 2012-04-30: 40000 [IU] via SUBCUTANEOUS

## 2012-04-30 MED ORDER — EPOETIN ALFA 40000 UNIT/ML IJ SOLN
INTRAMUSCULAR | Status: AC
  Filled 2012-04-30: qty 1

## 2012-04-30 NOTE — Progress Notes (Signed)
Terri Kelly's reason for visit today is for an injection and labs as scheduled per MD orders.  Labs were drawn prior to administration of ordered medication.  Venipuncture performed with a 23 gauge butterfly needle to R Antecubital.  Terri Kelly also received Procrit per MD orders; see MAR for administration details.  Terri Kelly tolerated all procedures well and without incident; questions were answered and patient was discharged.  

## 2012-05-03 ENCOUNTER — Other Ambulatory Visit: Payer: Self-pay | Admitting: Family Medicine

## 2012-05-05 ENCOUNTER — Encounter: Payer: Self-pay | Admitting: Family Medicine

## 2012-05-07 ENCOUNTER — Other Ambulatory Visit (HOSPITAL_COMMUNITY): Payer: Medicare Other

## 2012-05-07 ENCOUNTER — Ambulatory Visit (HOSPITAL_COMMUNITY): Payer: Medicare Other

## 2012-05-14 ENCOUNTER — Ambulatory Visit (HOSPITAL_COMMUNITY): Payer: Medicare Other

## 2012-05-21 ENCOUNTER — Ambulatory Visit (HOSPITAL_COMMUNITY): Payer: Medicare Other

## 2012-05-21 ENCOUNTER — Other Ambulatory Visit (HOSPITAL_COMMUNITY): Payer: Medicare Other

## 2012-05-24 ENCOUNTER — Ambulatory Visit (INDEPENDENT_AMBULATORY_CARE_PROVIDER_SITE_OTHER): Payer: Medicare Other

## 2012-05-24 ENCOUNTER — Telehealth: Payer: Self-pay | Admitting: Family Medicine

## 2012-05-24 VITALS — BP 140/74 | Wt 208.0 lb

## 2012-05-24 DIAGNOSIS — M25569 Pain in unspecified knee: Secondary | ICD-10-CM

## 2012-05-24 MED ORDER — METHYLPREDNISOLONE ACETATE 80 MG/ML IJ SUSP
80.0000 mg | Freq: Once | INTRAMUSCULAR | Status: AC
  Administered 2012-05-24: 80 mg via INTRAMUSCULAR

## 2012-05-24 MED ORDER — KETOROLAC TROMETHAMINE 60 MG/2ML IJ SOLN
60.0000 mg | Freq: Once | INTRAMUSCULAR | Status: AC
  Administered 2012-05-24: 60 mg via INTRAMUSCULAR

## 2012-05-24 NOTE — Telephone Encounter (Signed)
Patient would like to come in for injections for leg pain.   Verbal given to give T60 and D80.  No steroid given since October 2013.

## 2012-05-24 NOTE — Telephone Encounter (Signed)
Noted and this is correct 

## 2012-05-28 ENCOUNTER — Ambulatory Visit (HOSPITAL_COMMUNITY): Payer: Medicare Other

## 2012-05-28 ENCOUNTER — Other Ambulatory Visit (HOSPITAL_COMMUNITY): Payer: Medicare Other

## 2012-05-31 ENCOUNTER — Encounter (HOSPITAL_COMMUNITY): Payer: Medicare Other

## 2012-05-31 ENCOUNTER — Encounter (HOSPITAL_COMMUNITY): Payer: Medicare Other | Attending: Nephrology

## 2012-05-31 VITALS — BP 159/70 | HR 78

## 2012-05-31 DIAGNOSIS — N186 End stage renal disease: Secondary | ICD-10-CM

## 2012-05-31 DIAGNOSIS — D649 Anemia, unspecified: Secondary | ICD-10-CM | POA: Insufficient documentation

## 2012-05-31 LAB — CBC
HCT: 26.7 % — ABNORMAL LOW (ref 36.0–46.0)
MCH: 27.6 pg (ref 26.0–34.0)
MCV: 84.8 fL (ref 78.0–100.0)
Platelets: 289 10*3/uL (ref 150–400)
RDW: 14.3 % (ref 11.5–15.5)

## 2012-05-31 MED ORDER — EPOETIN ALFA 40000 UNIT/ML IJ SOLN
INTRAMUSCULAR | Status: AC
  Filled 2012-05-31: qty 1

## 2012-05-31 MED ORDER — EPOETIN ALFA 40000 UNIT/ML IJ SOLN
40000.0000 [IU] | Freq: Once | INTRAMUSCULAR | Status: AC
  Administered 2012-05-31: 40000 [IU] via SUBCUTANEOUS

## 2012-05-31 NOTE — Progress Notes (Signed)
Terri Kelly presents today for injection per MD orders. Procrit 40000 units administered SQ in right Abdomen. Administration without incident. Patient tolerated well.  

## 2012-06-02 ENCOUNTER — Other Ambulatory Visit: Payer: Self-pay

## 2012-06-02 MED ORDER — TRIAMTERENE-HCTZ 75-50 MG PO TABS: 1.0000 | ORAL_TABLET | Freq: Every day | ORAL | Status: DC

## 2012-06-04 ENCOUNTER — Other Ambulatory Visit (HOSPITAL_COMMUNITY): Payer: Medicare Other

## 2012-06-04 ENCOUNTER — Ambulatory Visit (HOSPITAL_COMMUNITY): Payer: Medicare Other

## 2012-06-06 NOTE — Progress Notes (Signed)
-  rescheduled due to weather-  

## 2012-06-07 ENCOUNTER — Other Ambulatory Visit (HOSPITAL_COMMUNITY): Payer: Medicare Other

## 2012-06-07 ENCOUNTER — Ambulatory Visit (HOSPITAL_COMMUNITY): Payer: Medicare Other | Admitting: Oncology

## 2012-06-07 ENCOUNTER — Ambulatory Visit (HOSPITAL_COMMUNITY): Payer: Medicare Other

## 2012-06-28 ENCOUNTER — Encounter (HOSPITAL_COMMUNITY): Payer: Self-pay | Admitting: Oncology

## 2012-06-28 ENCOUNTER — Encounter (HOSPITAL_COMMUNITY): Payer: Medicare Other | Attending: Nephrology | Admitting: Oncology

## 2012-06-28 VITALS — BP 184/83 | HR 91 | Temp 97.9°F | Resp 20 | Wt 221.8 lb

## 2012-06-28 DIAGNOSIS — N19 Unspecified kidney failure: Secondary | ICD-10-CM

## 2012-06-28 DIAGNOSIS — D649 Anemia, unspecified: Secondary | ICD-10-CM | POA: Insufficient documentation

## 2012-06-28 DIAGNOSIS — I10 Essential (primary) hypertension: Secondary | ICD-10-CM

## 2012-06-28 DIAGNOSIS — N186 End stage renal disease: Secondary | ICD-10-CM

## 2012-06-28 DIAGNOSIS — E213 Hyperparathyroidism, unspecified: Secondary | ICD-10-CM

## 2012-06-28 LAB — CBC
HCT: 29.2 % — ABNORMAL LOW (ref 36.0–46.0)
Hemoglobin: 9.6 g/dL — ABNORMAL LOW (ref 12.0–15.0)
MCH: 27.6 pg (ref 26.0–34.0)
MCHC: 32.9 g/dL (ref 30.0–36.0)
RDW: 15 % (ref 11.5–15.5)

## 2012-06-28 MED ORDER — EPOETIN ALFA 40000 UNIT/ML IJ SOLN
INTRAMUSCULAR | Status: AC
  Filled 2012-06-28: qty 1

## 2012-06-28 MED ORDER — EPOETIN ALFA 40000 UNIT/ML IJ SOLN
40000.0000 [IU] | Freq: Once | INTRAMUSCULAR | Status: AC
  Administered 2012-06-28: 40000 [IU] via SUBCUTANEOUS

## 2012-06-28 NOTE — Progress Notes (Signed)
Terri Kelly presented for labwork. Labs per MD order drawn via Peripheral Line 23 gauge needle inserted in right AC  Good blood return present. Procedure without incident.  Needle removed intact. Patient tolerated procedure well.  Terri Kelly presents today for injection per MD orders. Procrit 40,000units administered SQ in right Abdomen. Administration without incident. Patient tolerated well.

## 2012-06-28 NOTE — Patient Instructions (Addendum)
[facility] Discharge Instructions  RECOMMENDATIONS MADE BY THE CONSULTANT AND ANY TEST RESULTS WILL BE SENT TO YOUR REFERRING PHYSICIAN.  EXAM FINDINGS BY THE PHYSICIAN TODAY AND SIGNS OR SYMPTOMS TO REPORT TO CLINIC OR PRIMARY PHYSICIAN: Exam and discussion by PA.  Need to continue weekly injections.  MEDICATIONS PRESCRIBED:  None   INSTRUCTIONS GIVEN AND DISCUSSED: Report increased fatigue or shortness of breath.  SPECIAL INSTRUCTIONS/FOLLOW-UP: Weekly Procrit injections, blood work every 3 weeks and to be seen in follow-up in 10 weeks.  Thank you for choosing [facility] to provide your oncology and hematology care.  To afford each patient quality time with our providers, please arrive at least 15 minutes before your scheduled appointment time.  With your help, our goal is to use those 15 minutes to complete the necessary work-up to ensure our physicians have the information they need to help with your evaluation and healthcare recommendations.    Effective January 1st, 2014, we ask that you re-schedule your appointment with our physicians should you arrive 10 or more minutes late for your appointment.  We strive to give you quality time with our providers, and arriving late affects you and other patients whose appointments are after yours.    Again, thank you for choosing [facility].  Our hope is that these requests will decrease the amount of time that you wait before being seen by our physicians.       _____________________________________________________________  Should you have questions after your visit to [facility], please contact our office at [phone] between the hours of 8:30 a.m. and 5:00 p.m.  Voicemails left after 4:30 p.m. will not be returned until the following business day.  For prescription refill requests, have your pharmacy contact our office with your prescription refill request.

## 2012-06-28 NOTE — Progress Notes (Signed)
Terri Overman, MD [address] [place] [address]  Normocytic anemia  ESRD (end stage renal disease)  CURRENT THERAPY: Procrit 40,000 units weekly with poor compliance  INTERVAL HISTORY: Terri Kelly 77 y.o. female returns for  regular  visit for followup of anemia in the setting of end-stage renal failure and patient decline hemodialysis.   She is due to follow-up with nephrologist in the near future.    She reports that she feels great.  She denies ny complaints.  She denies any blood loss.   I personally reviewed and went over laboratory results with the patient.  Hgb has increased over the past 2-3 months with current regimen.  She has missed a number of doses and she blames it on the weather being poor.    She is agreeable to continue with weekly Procrit 40,000 units.  When her Hgb is up in the 10 g/dL range we can consider switching to Aranesp or spacing out Procrit frequency.     She was last given Procrit on 05/31/12 and she is therefore overdue.  We will give her an injection today.    Hematologically she denies any complaints and ROS questioning is negative.    Past Medical History  Diagnosis Date  . Hyperlipidemia   . Obesity   . Hypertension   . Diabetes mellitus type 1   . Back pain   . Sleep apnea 2009    non compliant with c-pap  . Diastolic dysfunction Nov 2011    Hospitalised for 3 days at [facility]   . Chronic kidney disease   . Normocytic anemia 04/09/2012  . Allergy   . Arthritis     has IGT (impaired glucose tolerance); HYPERLIPIDEMIA; OBESITY; HYPERTENSION; ESRD (end stage renal disease); HIP PAIN, LEFT; KNEE PAIN, BILATERAL; BACK PAIN; DISORDER OF BONE AND CARTILAGE UNSPECIFIED; FATIGUE; ABNORMAL THYROID FUNCTION TESTS; LEG PAIN; Hyperparathyroidism, secondary renal; and Normocytic anemia on her problem list.     is allergic to ace inhibitors and angiotensin receptor blockers.  Terri Kelly does not currently have medications on  file.  Past Surgical History  Procedure Laterality Date  . Vaginal hysterectomy  1978  . Myomectomy  1978  . Right knee arthroscopy  1976  . Right knee replacement      Denies any headaches, dizziness, double vision, fevers, chills, night sweats, nausea, vomiting, diarrhea, constipation, chest pain, heart palpitations, shortness of breath, blood in stool, black tarry stool, urinary pain, urinary burning, urinary frequency, hematuria.   PHYSICAL EXAMINATION  ECOG PERFORMANCE STATUS: 2 - Symptomatic, <50% confined to bed  Filed Vitals:   06/28/12 1500  BP: 184/83  Pulse: 91  Temp: 97.9 F (36.6 C)  Resp: 20    GENERAL:alert, no distress, well nourished, well developed, comfortable, cooperative, obese and smiling SKIN: skin color, texture, turgor are normal, no rashes or significant lesions HEAD: Normocephalic, No masses, lesions, tenderness or abnormalities EYES: normal, Conjunctiva are pink and non-injected EARS: External ears normal OROPHARYNX:mucous membranes are moist  NECK: supple, trachea midline LYMPH:  not examined BREAST:not examined LUNGS: clear to auscultation and percussion HEART: regular rate & rhythm, no gallops, S1 normal, S2 normal and 1/6 systolic ejection murmur heard best on the LSB 2nd ICS. ABDOMEN:obese and normal bowel sounds BACK: Back symmetric, no curvature. EXTREMITIES:less then 2 second capillary refill, no skin discoloration, no clubbing, no cyanosis, positive findings:  edema B/L LE edema  NEURO: alert & oriented x 3 with fluent speech, no focal motor/sensory deficits  LABORATORY DATA: CBC    Component Value Date/Time   WBC 9.4 05/31/2012 1518   RBC 3.15* 05/31/2012 1518   HGB 8.7* 05/31/2012 1518   HCT 26.7* 05/31/2012 1518   PLT 289 05/31/2012 1518   MCV 84.8 05/31/2012 1518   MCH 27.6 05/31/2012 1518   MCHC 32.6 05/31/2012 1518   RDW 14.3 05/31/2012 1518   LYMPHSABS 2.0 04/09/2012 1606   MONOABS 0.7 04/09/2012 1606   EOSABS 0.2 04/09/2012  1606   BASOSABS 0.1 04/09/2012 1606      ASSESSMENT:  1. Normocytic anemia  2. End-stage renal failure, followed by Dr. Hyman Hopes (nephrologist). Patient has declined dialysis  3. Back pain  4. Hyperlipidemia  5. Hyperparathyroidism secondary to renal disease  6. HTN  7. Obesity    PLAN:  1. I personally reviewed and went over laboratory results with the patient. 2. CBC today 3. Procrit injection 40,000 units today 4. Continue weekly Procrit 40,000 units.  5. CBC every 3 weeks 6. Return in 10 weeks for follow-up.  All questions were answered. The patient knows to call the clinic with any problems, questions or concerns. We can certainly see the patient much sooner if necessary.  The patient and plan discussed with Glenford Peers, MD and he is in agreement with the aforementioned.  KEFALAS,THOMAS

## 2012-06-29 ENCOUNTER — Telehealth: Payer: Self-pay | Admitting: Family Medicine

## 2012-06-29 DIAGNOSIS — R7302 Impaired glucose tolerance (oral): Secondary | ICD-10-CM

## 2012-06-29 DIAGNOSIS — E785 Hyperlipidemia, unspecified: Secondary | ICD-10-CM

## 2012-06-29 DIAGNOSIS — R7301 Impaired fasting glucose: Secondary | ICD-10-CM

## 2012-06-29 DIAGNOSIS — I10 Essential (primary) hypertension: Secondary | ICD-10-CM

## 2012-06-29 DIAGNOSIS — E669 Obesity, unspecified: Secondary | ICD-10-CM

## 2012-06-29 DIAGNOSIS — R5381 Other malaise: Secondary | ICD-10-CM

## 2012-06-29 NOTE — Telephone Encounter (Signed)
Labs ordered and will mail to patient

## 2012-06-29 NOTE — Telephone Encounter (Signed)
pls sched appt in mid June for pt  Pls order fasting lipid, hepatic, hBA1C and TSH she needs to have this before the appt

## 2012-06-29 NOTE — Addendum Note (Signed)
Addended by: Abner Greenspan on: 06/29/2012 11:47 AM   Modules accepted: Orders

## 2012-06-30 ENCOUNTER — Telehealth: Payer: Self-pay | Admitting: Family Medicine

## 2012-06-30 DIAGNOSIS — N2581 Secondary hyperparathyroidism of renal origin: Secondary | ICD-10-CM

## 2012-06-30 DIAGNOSIS — E538 Deficiency of other specified B group vitamins: Secondary | ICD-10-CM

## 2012-07-01 ENCOUNTER — Other Ambulatory Visit: Payer: Self-pay | Admitting: Family Medicine

## 2012-07-02 MED ORDER — AMLODIPINE BESYLATE 10 MG PO TABS: ORAL_TABLET | ORAL | Status: DC

## 2012-07-02 MED ORDER — OMEPRAZOLE 20 MG PO CPDR: 20.0000 mg | DELAYED_RELEASE_CAPSULE | Freq: Two times a day (BID) | ORAL | Status: DC

## 2012-07-02 MED ORDER — LABETALOL HCL 200 MG PO TABS: 200.0000 mg | ORAL_TABLET | Freq: Two times a day (BID) | ORAL | Status: DC

## 2012-07-02 MED ORDER — LEVOTHYROXINE SODIUM 25 MCG PO TABS: ORAL_TABLET | ORAL | Status: DC

## 2012-07-02 MED ORDER — TRIAMTERENE-HCTZ 75-50 MG PO TABS: ORAL_TABLET | ORAL | Status: DC

## 2012-07-02 MED ORDER — LOVASTATIN 40 MG PO TABS: ORAL_TABLET | ORAL | Status: DC

## 2012-07-02 MED ORDER — PARICALCITOL 1 MCG PO CAPS: ORAL_CAPSULE | ORAL | Status: DC

## 2012-07-02 MED ORDER — HYDROCODONE-ACETAMINOPHEN 7.5-750 MG PO TABS: 1.0000 | ORAL_TABLET | Freq: Three times a day (TID) | ORAL | Status: DC

## 2012-07-02 MED ORDER — FOLIC ACID 1 MG PO TABS: 1.0000 mg | ORAL_TABLET | Freq: Every day | ORAL | Status: DC

## 2012-07-02 NOTE — Telephone Encounter (Signed)
meds sent

## 2012-07-05 ENCOUNTER — Encounter (HOSPITAL_BASED_OUTPATIENT_CLINIC_OR_DEPARTMENT_OTHER): Payer: Medicare Other

## 2012-07-05 VITALS — BP 158/78 | HR 74 | Resp 18

## 2012-07-05 DIAGNOSIS — N19 Unspecified kidney failure: Secondary | ICD-10-CM

## 2012-07-05 DIAGNOSIS — N186 End stage renal disease: Secondary | ICD-10-CM

## 2012-07-05 DIAGNOSIS — D649 Anemia, unspecified: Secondary | ICD-10-CM

## 2012-07-05 LAB — CBC
MCHC: 32 g/dL (ref 30.0–36.0)
Platelets: 312 10*3/uL (ref 150–400)
RDW: 16.2 % — ABNORMAL HIGH (ref 11.5–15.5)

## 2012-07-05 MED ORDER — EPOETIN ALFA 40000 UNIT/ML IJ SOLN
INTRAMUSCULAR | Status: AC
  Filled 2012-07-05: qty 1

## 2012-07-05 MED ORDER — EPOETIN ALFA 40000 UNIT/ML IJ SOLN
40000.0000 [IU] | Freq: Once | INTRAMUSCULAR | Status: AC
  Administered 2012-07-05: 40000 [IU] via SUBCUTANEOUS

## 2012-07-05 NOTE — Progress Notes (Signed)
Terri Kelly presents today for injection per MD orders. Procrit 40,000 units administered SQ in left Abdomen. Administration without incident. Patient tolerated well.

## 2012-07-06 ENCOUNTER — Telehealth: Payer: Self-pay | Admitting: Family Medicine

## 2012-07-08 ENCOUNTER — Other Ambulatory Visit (HOSPITAL_COMMUNITY): Payer: Self-pay | Admitting: Oncology

## 2012-07-08 ENCOUNTER — Other Ambulatory Visit: Payer: Self-pay | Admitting: Family Medicine

## 2012-07-08 DIAGNOSIS — E538 Deficiency of other specified B group vitamins: Secondary | ICD-10-CM

## 2012-07-08 MED ORDER — FOLIC ACID 1 MG PO TABS: 1.0000 mg | ORAL_TABLET | Freq: Every day | ORAL | Status: DC

## 2012-07-08 NOTE — Telephone Encounter (Signed)
msg sent to them to disregard the zemplar. Dr Lodema Hong not original prescriber

## 2012-07-12 ENCOUNTER — Telehealth: Payer: Self-pay | Admitting: Family Medicine

## 2012-07-12 ENCOUNTER — Encounter (HOSPITAL_BASED_OUTPATIENT_CLINIC_OR_DEPARTMENT_OTHER): Payer: Medicare Other

## 2012-07-12 DIAGNOSIS — N039 Chronic nephritic syndrome with unspecified morphologic changes: Secondary | ICD-10-CM

## 2012-07-12 DIAGNOSIS — D649 Anemia, unspecified: Secondary | ICD-10-CM

## 2012-07-12 DIAGNOSIS — N186 End stage renal disease: Secondary | ICD-10-CM

## 2012-07-12 LAB — CBC
HCT: 31.9 % — ABNORMAL LOW (ref 36.0–46.0)
MCV: 84.8 fL (ref 78.0–100.0)
Platelets: 358 10*3/uL (ref 150–400)
RBC: 3.76 MIL/uL — ABNORMAL LOW (ref 3.87–5.11)
WBC: 7.6 10*3/uL (ref 4.0–10.5)

## 2012-07-12 MED ORDER — HYDROCODONE-ACETAMINOPHEN 7.5-325 MG PO TABS: 1.0000 | ORAL_TABLET | Freq: Three times a day (TID) | ORAL | Status: DC | PRN

## 2012-07-12 MED ORDER — EPOETIN ALFA 40000 UNIT/ML IJ SOLN
INTRAMUSCULAR | Status: AC
  Filled 2012-07-12: qty 1

## 2012-07-12 MED ORDER — EPOETIN ALFA 40000 UNIT/ML IJ SOLN
40000.0000 [IU] | Freq: Once | INTRAMUSCULAR | Status: AC
  Administered 2012-07-12: 40000 [IU] via SUBCUTANEOUS

## 2012-07-12 NOTE — Telephone Encounter (Signed)
Will send to local pharmacy

## 2012-07-12 NOTE — Progress Notes (Signed)
VSS. Procrit 40,000 units given sub-q to lower right abd.  Tolerated well.

## 2012-07-19 ENCOUNTER — Encounter (HOSPITAL_BASED_OUTPATIENT_CLINIC_OR_DEPARTMENT_OTHER): Payer: Medicare Other

## 2012-07-19 VITALS — BP 160/76 | HR 74 | Resp 16

## 2012-07-19 DIAGNOSIS — D649 Anemia, unspecified: Secondary | ICD-10-CM

## 2012-07-19 DIAGNOSIS — N186 End stage renal disease: Secondary | ICD-10-CM

## 2012-07-19 DIAGNOSIS — D631 Anemia in chronic kidney disease: Secondary | ICD-10-CM

## 2012-07-19 LAB — CBC
HCT: 33.3 % — ABNORMAL LOW (ref 36.0–46.0)
Hemoglobin: 10.7 g/dL — ABNORMAL LOW (ref 12.0–15.0)
RBC: 3.87 MIL/uL (ref 3.87–5.11)
WBC: 8.5 10*3/uL (ref 4.0–10.5)

## 2012-07-19 MED ORDER — EPOETIN ALFA 40000 UNIT/ML IJ SOLN
40000.0000 [IU] | Freq: Once | INTRAMUSCULAR | Status: AC
  Administered 2012-07-19: 40000 [IU] via SUBCUTANEOUS

## 2012-07-19 MED ORDER — EPOETIN ALFA 40000 UNIT/ML IJ SOLN
INTRAMUSCULAR | Status: AC
  Filled 2012-07-19: qty 1

## 2012-07-19 NOTE — Progress Notes (Signed)
Terri Kelly presents today for injection per the provider's orders.  Procrit administered administration without incident; see MAR for injection details.  Patient tolerated procedure well and without incident.  No questions or complaints noted at this time.  Lab Results  Component Value Date   HGB 10.7* 07/19/2012

## 2012-07-19 NOTE — Progress Notes (Signed)
Labs drawn today for cbc 

## 2012-07-26 ENCOUNTER — Other Ambulatory Visit (HOSPITAL_COMMUNITY): Payer: Self-pay | Admitting: Oncology

## 2012-07-26 ENCOUNTER — Encounter (HOSPITAL_COMMUNITY): Payer: Medicare Other | Attending: Nephrology

## 2012-07-26 VITALS — BP 158/72 | HR 72

## 2012-07-26 DIAGNOSIS — N186 End stage renal disease: Secondary | ICD-10-CM | POA: Insufficient documentation

## 2012-07-26 DIAGNOSIS — D649 Anemia, unspecified: Secondary | ICD-10-CM | POA: Insufficient documentation

## 2012-07-26 LAB — CBC
HCT: 36.6 % (ref 36.0–46.0)
Hemoglobin: 11.7 g/dL — ABNORMAL LOW (ref 12.0–15.0)
MCH: 27.3 pg (ref 26.0–34.0)
MCV: 85.5 fL (ref 78.0–100.0)
RBC: 4.28 MIL/uL (ref 3.87–5.11)
WBC: 9.3 10*3/uL (ref 4.0–10.5)

## 2012-07-26 NOTE — Progress Notes (Signed)
Patient did not receive Procrit due to lab results.  Terri Fus, PA notified.  Supportive therapy to be changed to Q2weeks.  Mazzie notified and she will change schedule and notify patient.

## 2012-07-26 NOTE — Progress Notes (Signed)
Patient's Hgb noted to be 11.7 g/dL.  Will space out Procrit injection 40,000 units to every 2 weeks.    KEFALAS,THOMAS

## 2012-08-02 ENCOUNTER — Encounter (HOSPITAL_COMMUNITY): Payer: Medicare Other

## 2012-08-02 ENCOUNTER — Other Ambulatory Visit (HOSPITAL_COMMUNITY): Payer: Medicare Other

## 2012-08-09 ENCOUNTER — Encounter (HOSPITAL_COMMUNITY): Payer: Medicare Other | Attending: Oncology

## 2012-08-09 ENCOUNTER — Encounter (HOSPITAL_BASED_OUTPATIENT_CLINIC_OR_DEPARTMENT_OTHER): Payer: Medicare Other

## 2012-08-09 DIAGNOSIS — N186 End stage renal disease: Secondary | ICD-10-CM

## 2012-08-09 DIAGNOSIS — D649 Anemia, unspecified: Secondary | ICD-10-CM

## 2012-08-09 LAB — CBC
HCT: 35.5 % — ABNORMAL LOW (ref 36.0–46.0)
Hemoglobin: 11.3 g/dL — ABNORMAL LOW (ref 12.0–15.0)
MCHC: 31.8 g/dL (ref 30.0–36.0)
MCV: 84.9 fL (ref 78.0–100.0)
RDW: 16.2 % — ABNORMAL HIGH (ref 11.5–15.5)

## 2012-08-09 NOTE — Progress Notes (Signed)
Injection not given due to lab results, patient scheduled for return appointment.

## 2012-08-09 NOTE — Progress Notes (Signed)
Labs drawn today for cbc 

## 2012-08-12 ENCOUNTER — Telehealth: Payer: Self-pay | Admitting: Family Medicine

## 2012-08-12 MED ORDER — HYDROCODONE-ACETAMINOPHEN 7.5-325 MG PO TABS: 1.0000 | ORAL_TABLET | Freq: Three times a day (TID) | ORAL | Status: DC | PRN

## 2012-08-12 NOTE — Telephone Encounter (Signed)
Printed to be faxed in today

## 2012-08-19 ENCOUNTER — Telehealth: Payer: Self-pay | Admitting: Family Medicine

## 2012-08-19 ENCOUNTER — Encounter: Payer: Self-pay | Admitting: Family Medicine

## 2012-08-19 NOTE — Telephone Encounter (Signed)
Script written pls follow through

## 2012-08-19 NOTE — Telephone Encounter (Signed)
Spoke with daughter and she requested that rx go to [facility].  Rx faxed.

## 2012-08-23 ENCOUNTER — Encounter (HOSPITAL_COMMUNITY): Payer: Medicare Other

## 2012-08-23 ENCOUNTER — Other Ambulatory Visit (HOSPITAL_COMMUNITY): Payer: Medicare Other

## 2012-08-30 ENCOUNTER — Encounter (HOSPITAL_COMMUNITY): Payer: Medicare Other | Attending: Hematology and Oncology

## 2012-08-30 VITALS — BP 146/80 | HR 73 | Resp 16

## 2012-08-30 DIAGNOSIS — D649 Anemia, unspecified: Secondary | ICD-10-CM | POA: Insufficient documentation

## 2012-08-30 DIAGNOSIS — I120 Hypertensive chronic kidney disease with stage 5 chronic kidney disease or end stage renal disease: Secondary | ICD-10-CM

## 2012-08-30 DIAGNOSIS — N186 End stage renal disease: Secondary | ICD-10-CM

## 2012-08-30 LAB — CBC
HCT: 31.5 % — ABNORMAL LOW (ref 36.0–46.0)
Hemoglobin: 10.3 g/dL — ABNORMAL LOW (ref 12.0–15.0)
MCH: 26.8 pg (ref 26.0–34.0)
MCV: 82 fL (ref 78.0–100.0)
RBC: 3.84 MIL/uL — ABNORMAL LOW (ref 3.87–5.11)
WBC: 9.5 10*3/uL (ref 4.0–10.5)

## 2012-08-30 MED ORDER — EPOETIN ALFA 40000 UNIT/ML IJ SOLN
40000.0000 [IU] | Freq: Once | INTRAMUSCULAR | Status: AC
  Administered 2012-08-30: 40000 [IU] via SUBCUTANEOUS

## 2012-08-30 MED ORDER — EPOETIN ALFA 40000 UNIT/ML IJ SOLN
INTRAMUSCULAR | Status: AC
  Filled 2012-08-30: qty 1

## 2012-08-30 NOTE — Progress Notes (Signed)
Labs drawn today for cbc 

## 2012-08-30 NOTE — Progress Notes (Signed)
Terri Kelly presents today for injection per MD orders. Procrit 40,000 administered SQ in right Abdomen. Administration without incident. Patient tolerated well.

## 2012-09-06 ENCOUNTER — Encounter (HOSPITAL_COMMUNITY): Payer: Medicare Other

## 2012-09-06 ENCOUNTER — Ambulatory Visit (HOSPITAL_COMMUNITY): Payer: Medicare Other | Admitting: Oncology

## 2012-09-08 ENCOUNTER — Ambulatory Visit (INDEPENDENT_AMBULATORY_CARE_PROVIDER_SITE_OTHER): Payer: Medicare Other | Admitting: Family Medicine

## 2012-09-08 ENCOUNTER — Encounter: Payer: Self-pay | Admitting: Family Medicine

## 2012-09-08 VITALS — BP 170/88 | HR 80 | Resp 18 | Ht 60.0 in

## 2012-09-08 DIAGNOSIS — F4321 Adjustment disorder with depressed mood: Secondary | ICD-10-CM

## 2012-09-08 DIAGNOSIS — M62838 Other muscle spasm: Secondary | ICD-10-CM

## 2012-09-08 DIAGNOSIS — E785 Hyperlipidemia, unspecified: Secondary | ICD-10-CM

## 2012-09-08 DIAGNOSIS — N186 End stage renal disease: Secondary | ICD-10-CM

## 2012-09-08 DIAGNOSIS — I10 Essential (primary) hypertension: Secondary | ICD-10-CM

## 2012-09-08 MED ORDER — METHYLPREDNISOLONE ACETATE 80 MG/ML IJ SUSP
80.0000 mg | Freq: Once | INTRAMUSCULAR | Status: AC
  Administered 2012-09-08: 80 mg via INTRAMUSCULAR

## 2012-09-08 MED ORDER — KETOROLAC TROMETHAMINE 60 MG/2ML IJ SOLN
60.0000 mg | Freq: Once | INTRAMUSCULAR | Status: AC
  Administered 2012-09-08: 60 mg via INTRAMUSCULAR

## 2012-09-08 MED ORDER — DIAZEPAM 5 MG PO TABS: ORAL_TABLET | ORAL | Status: DC

## 2012-09-08 MED ORDER — PREDNISONE 5 MG PO TABS: 5.0000 mg | ORAL_TABLET | Freq: Two times a day (BID) | ORAL | Status: AC

## 2012-09-08 NOTE — Patient Instructions (Addendum)
F/u in 6 to  8 weeks, call if you need me before.  Cancel sooner appointment, please  Toradol 60mg  iM and depo medrol 80 mg IM in the office today   Prednisone twice daily for next 5 days, also muscle relaxant twice daily  You have neck spasmTorticollis, Acute You have suddenly (acutely) developed a twisted neck (torticollis). This is usually a self-limited condition. CAUSES  Acute torticollis may be caused by malposition, trauma or infection. Most commonly, acute torticollis is caused by sleeping in an awkward position. Torticollis may also be caused by the flexion, extension or twisting of the neck muscles beyond their normal position. Sometimes, the exact cause may not be known. SYMPTOMS  Usually, there is pain and limited movement of the neck. Your neck may twist to one side. DIAGNOSIS  The diagnosis is often made by physical examination. X-rays, CT scans or MRIs may be done if there is a history of trauma or concern of infection. TREATMENT  For a common, stiff neck that develops during sleep, treatment is focused on relaxing the contracted neck muscle. Medications (including shots) may be used to treat the problem. Most cases resolve in several days. Torticollis usually responds to conservative physical therapy. If left untreated, the shortened and spastic neck muscle can cause deformities in the face and neck. Rarely, surgery is required. HOME CARE INSTRUCTIONS   Use over-the-counter and prescription medications as directed by your caregiver.  Do stretching exercises and massage the neck as directed by your caregiver.  Follow up with physical therapy if needed and as directed by your caregiver. SEEK IMMEDIATE MEDICAL CARE IF:   You develop difficulty breathing or noisy breathing (stridor).  You drool, develop trouble swallowing or have pain with swallowing.  You develop numbness or weakness in the hands or feet.  You have changes in speech or vision.  You have problems with  urination or bowel movements.  You have difficulty walking.  You have a fever.  You have increased pain. MAKE SURE YOU:   Understand these instructions.  Will watch your condition.  Will get help right away if you are not doing well or get worse. Document Released: 03/07/2000 Document Revised: 06/02/2011 Document Reviewed: 04/18/2009 [facility] Patient Information 2014 [place], [place].   The muscle relaxant valium may make you drowsy, so please make sure that you get help when moving around , when taking it. If it makes you very sleepy , just only take one at bedtime for spasm

## 2012-09-08 NOTE — Progress Notes (Signed)
  Subjective:    Patient ID: Terri Kelly, female    DOB: 03/20/1936, 77 y.o.   MRN: [id number]  HPI 1 day h/o severe neck spasm. Pt buried her ailing spouse 1 day ago and woke this morning with left neck pain and spasm Crying, poor eye contact , clearly depressed, grieving , and emotionally shaken. States she does not "want to be a  Bother " to her children, that  They had been attentive to her spouse, I am sure and assured her that this was never the case and will never be, and that her children loved her as much as they did her deceased husband   Review of Systems See HPI Denies recent fever or chills. Denies sinus pressure, nasal congestion, ear pain or sore throat. Denies chest congestion, productive cough or wheezing. Denies chest pains, palpitations and leg swelling Denies abdominal pain, nausea, vomiting,diarrhea or constipation.   Denies dysuria, frequency, hesitancy or incontinence. Chronic generalized joint pain with limitation in mobility Denies headaches, seizures, numbness, or tingling.  Denies skin break down or rash.        Objective:   Physical Exam  Elderly female, alert tearful, depressed and in pain. HEENT: No facial asymmetry, EOMI, no sinus tenderness,  oropharynx pink and moist.  Neck decreased ROM with trapezius spasm no adenopathy.  Chest: Clear to auscultation bilaterally.  CVS: S1, S2 no murmurs, no S3.  ABD: Soft non tender. Bowel sounds normal.  Ext: No edema  MS: decreased  ROM spine, shoulders, hips and knees.  Skin: Intact, no ulcerations or rash noted.  Psych: Good eye contact, normal affect. Memory intact not anxious or depressed appearing.  CNS: CN 2-12 intact, power, tone and sensation normal throughout.       Assessment & Plan:

## 2012-09-10 DIAGNOSIS — F4321 Adjustment disorder with depressed mood: Secondary | ICD-10-CM | POA: Insufficient documentation

## 2012-09-10 NOTE — Assessment & Plan Note (Signed)
Has resisted dialysis up to the present , will gently re open this issue at her follow up

## 2012-09-10 NOTE — Assessment & Plan Note (Signed)
Systolic more elevated than desired , however will not attempt med adjustments at this time. Pt currently under significant stres, buried her spouse who had been ailing 1 day ago

## 2012-09-10 NOTE — Assessment & Plan Note (Signed)
Acute neck spasm, anti inflammatories, and muscle relaxants

## 2012-09-10 NOTE — Assessment & Plan Note (Signed)
Buried spouse 1 day ago and in grief , as is to be expected

## 2012-09-10 NOTE — Assessment & Plan Note (Signed)
Will need updated labs at her next visit, despite the fact that she has decided against dialysis in the past, since taking lipid lowering med this needs to be checked

## 2012-09-13 ENCOUNTER — Encounter (HOSPITAL_COMMUNITY): Payer: Medicare Other | Attending: Nephrology

## 2012-09-13 VITALS — BP 150/77 | HR 73 | Resp 16

## 2012-09-13 DIAGNOSIS — D649 Anemia, unspecified: Secondary | ICD-10-CM

## 2012-09-13 DIAGNOSIS — N186 End stage renal disease: Secondary | ICD-10-CM | POA: Insufficient documentation

## 2012-09-13 LAB — CBC
MCH: 26.9 pg (ref 26.0–34.0)
MCV: 82.1 fL (ref 78.0–100.0)
Platelets: 305 10*3/uL (ref 150–400)
RDW: 15.8 % — ABNORMAL HIGH (ref 11.5–15.5)

## 2012-09-13 MED ORDER — EPOETIN ALFA 40000 UNIT/ML IJ SOLN
40000.0000 [IU] | Freq: Once | INTRAMUSCULAR | Status: AC
  Administered 2012-09-13: 40000 [IU] via SUBCUTANEOUS

## 2012-09-13 MED ORDER — EPOETIN ALFA 40000 UNIT/ML IJ SOLN
INTRAMUSCULAR | Status: AC
  Filled 2012-09-13: qty 1

## 2012-09-13 NOTE — Progress Notes (Signed)
Terri Kelly presents today for injection per the provider's orders.  Procrit administered administration without incident; see MAR for injection details.  Patient tolerated procedure well and without incident.  No questions or complaints noted at this time.

## 2012-09-20 ENCOUNTER — Other Ambulatory Visit: Payer: Self-pay | Admitting: Family Medicine

## 2012-09-20 ENCOUNTER — Telehealth: Payer: Self-pay | Admitting: Family Medicine

## 2012-09-20 DIAGNOSIS — M542 Cervicalgia: Secondary | ICD-10-CM

## 2012-09-20 MED ORDER — DIAZEPAM 5 MG PO TABS: ORAL_TABLET | ORAL | Status: DC

## 2012-09-20 MED ORDER — PREDNISONE 5 MG PO TABS: 5.0000 mg | ORAL_TABLET | Freq: Two times a day (BID) | ORAL | Status: AC

## 2012-09-20 NOTE — Telephone Encounter (Signed)
Patient's daughter called back.  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  She is still experiencing neck stiffness and family is concerned.

## 2012-09-20 NOTE — Telephone Encounter (Signed)
Spoke with pt's daughter Silvio Pate, Pt is to do home therapy use muscle rub  And warm towel  Meds also sent in and she is to have c spine xray

## 2012-09-20 NOTE — Telephone Encounter (Signed)
Please advise 

## 2012-09-25 ENCOUNTER — Encounter (HOSPITAL_COMMUNITY): Payer: Self-pay | Admitting: *Deleted

## 2012-09-25 ENCOUNTER — Inpatient Hospital Stay (HOSPITAL_COMMUNITY)
Admission: EM | Admit: 2012-09-25 | Discharge: 2012-09-28 | DRG: 291 | Disposition: A | Discharge status: Home Health | Payer: Medicare Other | Attending: Internal Medicine | Admitting: Internal Medicine

## 2012-09-25 ENCOUNTER — Emergency Department (HOSPITAL_COMMUNITY): Payer: Medicare Other

## 2012-09-25 DIAGNOSIS — N189 Chronic kidney disease, unspecified: Secondary | ICD-10-CM

## 2012-09-25 DIAGNOSIS — N186 End stage renal disease: Secondary | ICD-10-CM | POA: Diagnosis present

## 2012-09-25 DIAGNOSIS — I5033 Acute on chronic diastolic (congestive) heart failure: Principal | ICD-10-CM | POA: Diagnosis present

## 2012-09-25 DIAGNOSIS — Z87891 Personal history of nicotine dependence: Secondary | ICD-10-CM

## 2012-09-25 DIAGNOSIS — Z79899 Other long term (current) drug therapy: Secondary | ICD-10-CM

## 2012-09-25 DIAGNOSIS — Z66 Do not resuscitate: Secondary | ICD-10-CM | POA: Diagnosis present

## 2012-09-25 DIAGNOSIS — D649 Anemia, unspecified: Secondary | ICD-10-CM

## 2012-09-25 DIAGNOSIS — D631 Anemia in chronic kidney disease: Secondary | ICD-10-CM | POA: Diagnosis present

## 2012-09-25 DIAGNOSIS — N039 Chronic nephritic syndrome with unspecified morphologic changes: Secondary | ICD-10-CM | POA: Diagnosis present

## 2012-09-25 DIAGNOSIS — Z91199 Patient's noncompliance with other medical treatment and regimen due to unspecified reason: Secondary | ICD-10-CM

## 2012-09-25 DIAGNOSIS — I509 Heart failure, unspecified: Secondary | ICD-10-CM

## 2012-09-25 DIAGNOSIS — G4730 Sleep apnea, unspecified: Secondary | ICD-10-CM | POA: Diagnosis present

## 2012-09-25 DIAGNOSIS — R9431 Abnormal electrocardiogram [ECG] [EKG]: Secondary | ICD-10-CM

## 2012-09-25 DIAGNOSIS — E785 Hyperlipidemia, unspecified: Secondary | ICD-10-CM | POA: Diagnosis present

## 2012-09-25 DIAGNOSIS — Z9119 Patient's noncompliance with other medical treatment and regimen: Secondary | ICD-10-CM

## 2012-09-25 DIAGNOSIS — E669 Obesity, unspecified: Secondary | ICD-10-CM | POA: Diagnosis present

## 2012-09-25 DIAGNOSIS — E109 Type 1 diabetes mellitus without complications: Secondary | ICD-10-CM | POA: Diagnosis present

## 2012-09-25 DIAGNOSIS — Z6841 Body Mass Index (BMI) 40.0 and over, adult: Secondary | ICD-10-CM

## 2012-09-25 DIAGNOSIS — I120 Hypertensive chronic kidney disease with stage 5 chronic kidney disease or end stage renal disease: Secondary | ICD-10-CM | POA: Diagnosis present

## 2012-09-25 DIAGNOSIS — I10 Essential (primary) hypertension: Secondary | ICD-10-CM | POA: Diagnosis present

## 2012-09-25 LAB — BASIC METABOLIC PANEL
BUN: 58 mg/dL — ABNORMAL HIGH (ref 6–23)
Calcium: 5.3 mg/dL — CL (ref 8.4–10.5)
Creatinine, Ser: 6.72 mg/dL — ABNORMAL HIGH (ref 0.50–1.10)
GFR calc Af Amer: 6 mL/min — ABNORMAL LOW (ref >90)
GFR calc non Af Amer: 5 mL/min — ABNORMAL LOW (ref >90)
Glucose, Bld: 107 mg/dL — ABNORMAL HIGH (ref 70–99)

## 2012-09-25 LAB — CBC WITH DIFFERENTIAL/PLATELET
Basophils Absolute: 0 10*3/uL (ref 0.0–0.1)
Basophils Relative: 0 % (ref 0–1)
Eosinophils Absolute: 0.1 10*3/uL (ref 0.0–0.7)
MCH: 26.4 pg (ref 26.0–34.0)
MCHC: 32.2 g/dL (ref 30.0–36.0)
Neutro Abs: 8.5 10*3/uL — ABNORMAL HIGH (ref 1.7–7.7)
Neutrophils Relative %: 80 % — ABNORMAL HIGH (ref 43–77)
RDW: 16 % — ABNORMAL HIGH (ref 11.5–15.5)

## 2012-09-25 LAB — HEPATIC FUNCTION PANEL
AST: 24 U/L (ref 0–37)
Albumin: 2.9 g/dL — ABNORMAL LOW (ref 3.5–5.2)
Bilirubin, Direct: <0.1 mg/dL (ref 0.0–0.3)
Total Bilirubin: 0.1 mg/dL — ABNORMAL LOW (ref 0.3–1.2)

## 2012-09-25 LAB — GLUCOSE, CAPILLARY: Glucose-Capillary: 102 mg/dL — ABNORMAL HIGH (ref 70–99)

## 2012-09-25 LAB — TROPONIN I: Troponin I: <0.3 ng/mL (ref <0.30)

## 2012-09-25 LAB — MAGNESIUM: Magnesium: 0.9 mg/dL — CL (ref 1.5–2.5)

## 2012-09-25 MED ORDER — AMLODIPINE BESYLATE 5 MG PO TABS
10.0000 mg | ORAL_TABLET | Freq: Every day | ORAL | Status: DC
  Administered 2012-09-25 – 2012-09-28 (×4): 10 mg via ORAL
  Filled 2012-09-25 (×4): qty 2

## 2012-09-25 MED ORDER — ASPIRIN 81 MG PO TBEC: 81.0000 mg | DELAYED_RELEASE_TABLET | Freq: Every day | ORAL | Status: DC

## 2012-09-25 MED ORDER — MAGNESIUM SULFATE 40 MG/ML IJ SOLN
2.0000 g | Freq: Once | INTRAMUSCULAR | Status: DC
  Filled 2012-09-25 (×2): qty 50

## 2012-09-25 MED ORDER — SODIUM CHLORIDE 0.9 % IJ SOLN: 3.0000 mL | INTRAMUSCULAR | Status: DC | PRN

## 2012-09-25 MED ORDER — SODIUM CHLORIDE 0.9 % IV SOLN
1.0000 g | Freq: Once | INTRAVENOUS | Status: AC
  Administered 2012-09-25: 1 g via INTRAVENOUS
  Filled 2012-09-25: qty 10

## 2012-09-25 MED ORDER — ONDANSETRON HCL 4 MG/2ML IJ SOLN: 4.0000 mg | Freq: Four times a day (QID) | INTRAMUSCULAR | Status: DC | PRN

## 2012-09-25 MED ORDER — ACETAMINOPHEN 325 MG PO TABS
650.0000 mg | ORAL_TABLET | ORAL | Status: DC | PRN
  Administered 2012-09-28: 650 mg via ORAL
  Filled 2012-09-25: qty 2

## 2012-09-25 MED ORDER — FUROSEMIDE 10 MG/ML IJ SOLN
40.0000 mg | Freq: Once | INTRAMUSCULAR | Status: DC
  Filled 2012-09-25: qty 4

## 2012-09-25 MED ORDER — HYDROCODONE-ACETAMINOPHEN 7.5-325 MG PO TABS
1.0000 | ORAL_TABLET | Freq: Three times a day (TID) | ORAL | Status: DC | PRN
  Administered 2012-09-27 (×2): 1 via ORAL
  Filled 2012-09-25 (×3): qty 1

## 2012-09-25 MED ORDER — FUROSEMIDE 10 MG/ML IJ SOLN
40.0000 mg | Freq: Once | INTRAMUSCULAR | Status: AC
  Administered 2012-09-25: 40 mg via INTRAVENOUS

## 2012-09-25 MED ORDER — NYSTATIN 100000 UNIT/GM EX POWD
CUTANEOUS | Status: AC
  Filled 2012-09-25: qty 15

## 2012-09-25 MED ORDER — SODIUM CHLORIDE 0.9 % IV SOLN: 250.0000 mL | INTRAVENOUS | Status: DC | PRN

## 2012-09-25 MED ORDER — PARICALCITOL 1 MCG PO CAPS
1.0000 ug | ORAL_CAPSULE | ORAL | Status: DC
  Administered 2012-09-27: 1 ug via ORAL
  Filled 2012-09-25 (×2): qty 1

## 2012-09-25 MED ORDER — HYDRALAZINE HCL 25 MG PO TABS
50.0000 mg | ORAL_TABLET | Freq: Two times a day (BID) | ORAL | Status: DC
  Administered 2012-09-26 – 2012-09-28 (×5): 50 mg via ORAL
  Filled 2012-09-25 (×6): qty 2

## 2012-09-25 MED ORDER — LABETALOL HCL 200 MG PO TABS
200.0000 mg | ORAL_TABLET | Freq: Two times a day (BID) | ORAL | Status: DC
  Administered 2012-09-26 – 2012-09-28 (×5): 200 mg via ORAL
  Filled 2012-09-25 (×6): qty 1

## 2012-09-25 MED ORDER — MAGNESIUM SULFATE 40 MG/ML IJ SOLN
2.0000 g | Freq: Once | INTRAMUSCULAR | Status: AC
  Administered 2012-09-25: 2 g via INTRAVENOUS

## 2012-09-25 MED ORDER — PANTOPRAZOLE SODIUM 40 MG PO TBEC
40.0000 mg | DELAYED_RELEASE_TABLET | Freq: Two times a day (BID) | ORAL | Status: DC
  Administered 2012-09-25 – 2012-09-28 (×6): 40 mg via ORAL
  Filled 2012-09-25 (×6): qty 1

## 2012-09-25 MED ORDER — FUROSEMIDE 10 MG/ML IJ SOLN
80.0000 mg | Freq: Two times a day (BID) | INTRAMUSCULAR | Status: DC
  Administered 2012-09-25 – 2012-09-28 (×6): 80 mg via INTRAVENOUS
  Filled 2012-09-25 (×6): qty 8

## 2012-09-25 MED ORDER — LEVOTHYROXINE SODIUM 25 MCG PO TABS
25.0000 ug | ORAL_TABLET | Freq: Every day | ORAL | Status: DC
  Administered 2012-09-26 – 2012-09-28 (×3): 25 ug via ORAL
  Filled 2012-09-25 (×3): qty 1

## 2012-09-25 MED ORDER — SODIUM BICARBONATE 650 MG PO TABS
325.0000 mg | ORAL_TABLET | Freq: Two times a day (BID) | ORAL | Status: DC
  Administered 2012-09-25 – 2012-09-26 (×2): 325 mg via ORAL
  Filled 2012-09-25 (×2): qty 1

## 2012-09-25 MED ORDER — SODIUM CHLORIDE 0.9 % IJ SOLN
3.0000 mL | Freq: Two times a day (BID) | INTRAMUSCULAR | Status: DC
  Administered 2012-09-25 – 2012-09-28 (×6): 3 mL via INTRAVENOUS

## 2012-09-25 MED ORDER — ALBUTEROL SULFATE (5 MG/ML) 0.5% IN NEBU
5.0000 mg | INHALATION_SOLUTION | Freq: Once | RESPIRATORY_TRACT | Status: AC
  Administered 2012-09-25: 5 mg via RESPIRATORY_TRACT
  Filled 2012-09-25: qty 1

## 2012-09-25 MED ORDER — SIMVASTATIN 20 MG PO TABS: 40.0000 mg | ORAL_TABLET | Freq: Every day | ORAL | Status: DC

## 2012-09-25 MED ORDER — ASPIRIN EC 81 MG PO TBEC
81.0000 mg | DELAYED_RELEASE_TABLET | Freq: Every day | ORAL | Status: DC
  Administered 2012-09-25 – 2012-09-28 (×4): 81 mg via ORAL
  Filled 2012-09-25 (×4): qty 1

## 2012-09-25 MED ORDER — ENOXAPARIN SODIUM 30 MG/0.3ML ~~LOC~~ SOLN
30.0000 mg | SUBCUTANEOUS | Status: DC
  Administered 2012-09-25 – 2012-09-27 (×3): 30 mg via SUBCUTANEOUS
  Filled 2012-09-25 (×3): qty 0.3

## 2012-09-25 MED ORDER — NYSTATIN 100000 UNIT/GM EX POWD
Freq: Two times a day (BID) | CUTANEOUS | Status: DC
  Administered 2012-09-25 – 2012-09-26 (×2): via TOPICAL
  Administered 2012-09-26: 1 via TOPICAL
  Administered 2012-09-27: 22:00:00 via TOPICAL
  Administered 2012-09-27: 1 via TOPICAL
  Administered 2012-09-28: 08:00:00 via TOPICAL
  Filled 2012-09-25: qty 15

## 2012-09-25 MED ORDER — IPRATROPIUM BROMIDE 0.02 % IN SOLN
0.5000 mg | Freq: Once | RESPIRATORY_TRACT | Status: AC
  Administered 2012-09-25: 0.5 mg via RESPIRATORY_TRACT
  Filled 2012-09-25: qty 2.5

## 2012-09-25 NOTE — ED Notes (Addendum)
CRITICAL VALUE ALERT  Critical value received: mag 0.9  Date of notification:  09/25/12  Time of notification: 1551  Critical value read back: yes  Nurse who received alert: j. Bing Quarry  MD notified (1st page): [email or web address]  Time of first page: 1552  MD notified (2nd page):  Time of second page:  Responding MD:   [email or web address]  Time MD responded: [phone]

## 2012-09-25 NOTE — ED Notes (Signed)
CRITICAL VALUE ALERT  Critical value received:  Calcium 5.3  + Date of notification:  July 5th 2014 Time of notification:  1447 Critical value read back:yes  Nurse who received alert:  Lilia Pro RN  MD notified (1st page):  Clarene Duke  Time of first page:  1447  MD notified (2nd page):  Time of second page:  Responding MD:  Clarene Duke  Time MD responded:  1447

## 2012-09-25 NOTE — ED Notes (Signed)
Bedside commode placed in room. 

## 2012-09-25 NOTE — ED Provider Notes (Signed)
History    CSN: [id number] Arrival date & time 09/25/12  1336  First MD Initiated Contact with Patient 09/25/12 1351     Chief Complaint  Patient presents with  . Shortness of Breath    HPI Pt was seen at 1355.  Per pt and her family, c/o gradual onset and worsening of persistent SOB and "wheezing" for the past 2 days, worse today. Has been associated with increasing pedal edema and non-productive cough. SOB worsens with laying flat and ambulation. Denies CP/palpitations, no abd pain, no N/V/D, no back pain, no fevers.     Past Medical History  Diagnosis Date  . Hyperlipidemia   . Obesity   . Hypertension   . Diabetes mellitus type 1   . Back pain   . Sleep apnea 2009    non compliant with c-pap  . Diastolic dysfunction Nov 2011    Hospitalised for 3 days at [facility]   . Normocytic anemia 04/09/2012  . Allergy   . Arthritis   . Chronic kidney disease     does not want HD   Past Surgical History  Procedure Laterality Date  . Vaginal hysterectomy  1978  . Myomectomy  1978  . Right knee arthroscopy  1976  . Right knee replacement     Family History  Problem Relation Age of Onset  . Heart disease Father   . Kidney disease Father   . Hypertension Brother     x1  . Hypertension Sister     x2  . Kidney failure Sister   . Kidney failure Brother   . Hypertension Brother    History  Substance Use Topics  . Smoking status: Former Games developer  . Smokeless tobacco: Never Used  . Alcohol Use: No    Review of Systems ROS: Statement: All systems negative except as marked or noted in the HPI; Constitutional: Negative for fever and chills. ; ; Eyes: Negative for eye pain, redness and discharge. ; ; ENMT: Negative for ear pain, hoarseness, nasal congestion, sinus pressure and sore throat. ; ; Cardiovascular: Negative for chest pain, palpitations, diaphoresis, +dyspnea and peripheral edema. ; ; Respiratory: +cough, wheezing. Negative for stridor. ; ; Gastrointestinal: Negative for  nausea, vomiting, diarrhea, abdominal pain, blood in stool, hematemesis, jaundice and rectal bleeding. . ; ; Genitourinary: Negative for dysuria, flank pain and hematuria. ; ; Musculoskeletal: Negative for back pain and neck pain. Negative for swelling and trauma.; ; Skin: Negative for pruritus, rash, abrasions, blisters, bruising and skin lesion.; ; Neuro: Negative for headache, lightheadedness and neck stiffness. Negative for weakness, altered level of consciousness , altered mental status, extremity weakness, paresthesias, involuntary movement, seizure and syncope.       Allergies  Ace inhibitors and Angiotensin receptor blockers  Home Medications   Current Outpatient Rx  Name  Route  Sig  Dispense  Refill  . amLODipine (NORVASC) 10 MG tablet      TAKE 1 TABLET BY MOUTH ONCE A DAY.   90 tablet   1   . aspirin (ASPIRIN LOW DOSE) 81 MG EC tablet   Oral   Take 81 mg by mouth daily.           . diazepam (VALIUM) 5 MG tablet      One twice daily for 1 week   30 tablet   0   . folic acid (FOLVITE) 1 MG tablet   Oral   Take 1 tablet (1 mg total) by mouth daily.   90 tablet  1   . hydrALAZINE (APRESOLINE) 50 MG tablet      TAKE (1) TABLET BY MOUTH TWICE A DAY.   60 tablet   3   . HYDROcodone-acetaminophen (NORCO) 7.5-325 MG per tablet   Oral   Take 1 tablet by mouth 3 (three) times daily as needed for pain.   90 tablet   1   . labetalol (NORMODYNE) 200 MG tablet   Oral   Take 1 tablet (200 mg total) by mouth 2 (two) times daily.   180 tablet   1   . levothyroxine (SYNTHROID, LEVOTHROID) 25 MCG tablet      TAKE (1) TABLET BY MOUTH ONCE DAILY.   90 tablet   1   . lovastatin (MEVACOR) 40 MG tablet      TAKE 2 TABLETS BY MOUTH AT BEDTIME FOR CHOLESTEROL.   180 tablet   1   . omeprazole (PRILOSEC) 20 MG capsule   Oral   Take 1 capsule (20 mg total) by mouth 2 (two) times daily.   180 capsule   1   . paricalcitol (ZEMPLAR) 1 MCG capsule      One capsule  Monday, Wednesday and friday   180 capsule   1   . predniSONE (DELTASONE) 5 MG tablet   Oral   Take 1 tablet (5 mg total) by mouth 2 (two) times daily.   14 tablet   0   . sodium bicarbonate 325 MG tablet   Oral   Take 325 mg by mouth 2 (two) times daily.          Marland Kitchen triamcinolone cream (KENALOG) 0.5 %      APPLY TO THE AFFECTED AREAS SPARINGLY TWICE DAILY AS NEEDED.   45 g   2   . triamterene-hydrochlorothiazide (MAXZIDE) 75-50 MG per tablet      One half tablet by mouth once a day   45 tablet   1    BP 139/71  Pulse 90  Temp(Src) 98.1 F (36.7 C) (Oral)  Resp 22  Ht 4\' 9"  (1.448 m)  Wt 216 lb (97.977 kg)  BMI 46.73 kg/m2  SpO2 96% Physical Exam 1400: Physical examination:  Nursing notes reviewed; Vital signs and O2 SAT reviewed;  Constitutional: Well developed, Well nourished, Well hydrated, In no acute distress; Head:  Normocephalic, atraumatic; Eyes: EOMI, PERRL, No scleral icterus; ENMT: Mouth and pharynx normal, Mucous membranes moist; Neck: Supple, Full range of motion, No lymphadenopathy; Cardiovascular: Regular rate and rhythm, No gallop; Respiratory: Breath sounds coarse & equal bilaterally, with bibasilar wheezes. No retrax or access mm use. Speaking full sentences with ease, Normal respiratory effort/excursion; Chest: Nontender, Movement normal; Abdomen: Soft, Nontender, Nondistended, Normal bowel sounds; Genitourinary: No CVA tenderness; Extremities: Pulses normal, No tenderness, +1 pedal edema bilat without calf asymmetry.; Neuro: AA&Ox3, Major CN grossly intact.  Speech clear. No gross focal motor or sensory deficits in extremities.; Skin: Color normal, Warm, Dry.   ED Course  Procedures    MDM  MDM Reviewed: previous chart, nursing note and vitals Reviewed previous: labs and ECG Interpretation: labs, ECG and x-ray    Date: 09/25/2012  Rate: 92  Rhythm: normal sinus rhythm  QRS Axis: left  Intervals: QT prolonged, QTc 529  ST/T Wave  abnormalities: normal  Conduction Disutrbances:none  Narrative Interpretation:   Old EKG Reviewed: changes noted; prolonged QT compared to previous EKG dated 02/05/2010.   Results for orders placed during the hospital encounter of 09/25/12  TROPONIN I  Result Value Range   Troponin I <0.30  <0.30 ng/mL  PRO B NATRIURETIC PEPTIDE      Result Value Range   Pro B Natriuretic peptide (BNP) 4794.0 (*) 0 - 125 pg/mL  BASIC METABOLIC PANEL      Result Value Range   Sodium 137  135 - 145 mEq/L   Potassium 5.2 (*) 3.5 - 5.1 mEq/L   Chloride 101  96 - 112 mEq/L   CO2 21  19 - 32 mEq/L   Glucose, Bld 107 (*) 70 - 99 mg/dL   BUN 58 (*) 6 - 23 mg/dL   Creatinine, Ser 1.61 (*) 0.50 - 1.10 mg/dL   Calcium 5.3 (*) 8.4 - 10.5 mg/dL   GFR calc non Af Amer 5 (*) >90 mL/min   GFR calc Af Amer 6 (*) >90 mL/min  CBC WITH DIFFERENTIAL      Result Value Range   WBC 10.6 (*) 4.0 - 10.5 K/uL   RBC 3.14 (*) 3.87 - 5.11 MIL/uL   Hemoglobin 8.3 (*) 12.0 - 15.0 g/dL   HCT 09.6 (*) 04.5 - 40.9 %   MCV 82.2  78.0 - 100.0 fL   MCH 26.4  26.0 - 34.0 pg   MCHC 32.2  30.0 - 36.0 g/dL   RDW 81.1 (*) 91.4 - 78.2 %   Platelets 320  150 - 400 K/uL   Neutrophils Relative % 80 (*) 43 - 77 %   Neutro Abs 8.5 (*) 1.7 - 7.7 K/uL   Lymphocytes Relative 11 (*) 12 - 46 %   Lymphs Abs 1.2  0.7 - 4.0 K/uL   Monocytes Relative 8  3 - 12 %   Monocytes Absolute 0.8  0.1 - 1.0 K/uL   Eosinophils Relative 1  0 - 5 %   Eosinophils Absolute 0.1  0.0 - 0.7 K/uL   Basophils Relative 0  0 - 1 %   Basophils Absolute 0.0  0.0 - 0.1 K/uL  HEPATIC FUNCTION PANEL      Result Value Range   Total Protein 6.9  6.0 - 8.3 g/dL   Albumin 2.9 (*) 3.5 - 5.2 g/dL   AST 24  0 - 37 U/L   ALT 17  0 - 35 U/L   Alkaline Phosphatase 92  39 - 117 U/L   Total Bilirubin 0.1 (*) 0.3 - 1.2 mg/dL   Bilirubin, Direct <9.5  0.0 - 0.3 mg/dL   Indirect Bilirubin NOT CALCULATED  0.3 - 0.9 mg/dL  MAGNESIUM      Result Value Range   Magnesium 0.9  (*) 1.5 - 2.5 mg/dL   Dg Chest Portable 1 View 09/25/2012   *RADIOLOGY REPORT*  Clinical Data: Shortness of breath, wheezing  PORTABLE CHEST - 1 VIEW  Comparison: 02/04/2010  Findings: Cardiomegaly with suspected mild interstitial edema.  Left lung base is poorly visualized.  Underlying atelectasis and/or effusion is difficult to exclude.  No pneumothorax.  IMPRESSION: Cardiomegaly with suspected mild interstitial edema.   Original Report Authenticated By: Charline Bills, M.D.    Results for VERNEL, DONLAN (MRN [id number]) as of 09/25/2012 15:43  Ref. Range 02/04/2010 12:20 09/25/2012 14:06  Pro B Natriuretic peptide (BNP) Latest Range: 0-125 pg/mL 35.0 4794.0 (H)    Results for JUNIOR, KENEDY (MRN [id number]) as of 09/25/2012 15:43  Ref. Range 12/25/2011 15:51 12/31/2011 16:00 01/28/2012 10:28 03/19/2012 08:21 04/09/2012 16:06 09/25/2012 14:06  BUN Latest Range: 6-23 mg/dL 54 (H) 35 (H) 57 (H) 57 (H) 62 (H) 58 (  H)  Creatinine Latest Range: 0.50-1.10 mg/dL 1.61 (H) 0.96 (H) 0.45 (H) 7.04 (H) 7.34 (H) 6.72 (H)   Results for ESTELENE, CARMACK (MRN [id number]) as of 09/25/2012 15:43  Ref. Range 12/31/2011 16:00 01/28/2012 10:28 03/19/2012 08:21 04/09/2012 16:06 09/25/2012 14:06  Calcium Latest Range: 8.4-10.5 mg/dL 9.2 8.6 7.4 (L) 8.2 (L) 5.3 (LL)     1500:  ESRD per hx of same; pt continues to refuse HD.  Ca corrects to 6.2 for albumin. Will replete calcium IV and magnesium IV due to new prolonged QT on EKG today.  BNP elevated from previous; will dose IV lasix.  Continues to deny CP/palpitations. Wheezing improved after neb tx; family states pt has hx of COPD but no dx of same per EPIC chart review. Dx and testing d/w pt and family.  Questions answered.  Verb understanding, agreeable to admit.  T/C to [facility] Dr. Kerry Hough, case discussed, including:  HPI, pertinent PM/SHx, VS/PE, dx testing, ED course and treatment:  Agreeable to admit, requests to write temporary orders, obtain tele bed to team 1.     Laray Anger, DO 09/26/12 [phone]

## 2012-09-25 NOTE — ED Notes (Signed)
Wheezing and SOB yesterday and got worse last night. NAD at this time.

## 2012-09-25 NOTE — H&P (Signed)
[facility] History and Physical  VIENNA FOLDEN ZOX:[id number] DOB: May 15, 1935 DOA: 09/25/2012  Referring physician: Dr. Clarene Duke, ED physician PCP: [name], MD  Specialists: Nephrologist: Dr. Hyman Hopes  Chief Complaint: shortness of breath  HPI: Terri Kelly is a 77 y.o. female with a history of end-stage renal disease who has declined progressing to dialysis. She presents to the emergency room today with complaints of shortness of breath. She describes onset of symptoms occurred approximately 2 days ago. She admits to worsening dyspnea on exertion, orthopnea. Family is also noted progressive ankle swelling over the past 2 days. Patient is relatively noncompliant with fluid intake as well as salt intake. She's not on a diuretic at home. She denies any chest pain, fever, cough, vomiting, diarrhea, dysuria, abdominal pain or any other complaints. She was evaluated in the emergency room and noted to be volume overload. She was also noted to be significantly hypocalcemic/hypomagnesemia. She's been referred for admission.  Review of Systems: Pertinent positives as per HPI, otherwise negative  Past Medical History  Diagnosis Date  . Hyperlipidemia   . Obesity   . Hypertension   . Diabetes mellitus type 1   . Back pain   . Sleep apnea 2009    non compliant with c-pap  . Diastolic dysfunction Nov 2011    Hospitalised for 3 days at [facility]   . Normocytic anemia 04/09/2012  . Allergy   . Arthritis   . Chronic kidney disease     does not want HD   Past Surgical History  Procedure Laterality Date  . Vaginal hysterectomy  1978  . Myomectomy  1978  . Right knee arthroscopy  1976  . Right knee replacement     Social History:  reports that she has quit smoking. She has never used smokeless tobacco. She reports that she does not drink alcohol or use illicit drugs. Ambulates with a walker  Allergies  Allergen Reactions  . Ace Inhibitors     REACTION: cough  . Angiotensin Receptor  Blockers     REACTION: cough    Family History  Problem Relation Age of Onset  . Heart disease Father   . Kidney disease Father   . Hypertension Brother     x1  . Hypertension Sister     x2  . Kidney failure Sister   . Kidney failure Brother   . Hypertension Brother     Prior to Admission medications   Medication Sig Start Date End Date Taking? Authorizing Provider  amLODipine (NORVASC) 10 MG tablet TAKE 1 TABLET BY MOUTH ONCE A DAY. 07/02/12   Kerri Perches, MD  aspirin (ASPIRIN LOW DOSE) 81 MG EC tablet Take 81 mg by mouth daily.      Historical Provider, MD  diazepam (VALIUM) 5 MG tablet One twice daily for 1 week 09/20/12 09/27/12  Kerri Perches, MD  folic acid (FOLVITE) 1 MG tablet Take 1 tablet (1 mg total) by mouth daily. 07/08/12   Ellouise Newer, PA-C  hydrALAZINE (APRESOLINE) 50 MG tablet TAKE (1) TABLET BY MOUTH TWICE A DAY. 07/01/12   Kerri Perches, MD  HYDROcodone-acetaminophen (NORCO) 7.5-325 MG per tablet Take 1 tablet by mouth 3 (three) times daily as needed for pain. 08/12/12   Kerri Perches, MD  labetalol (NORMODYNE) 200 MG tablet Take 1 tablet (200 mg total) by mouth 2 (two) times daily. 07/02/12   Kerri Perches, MD  levothyroxine (SYNTHROID, LEVOTHROID) 25 MCG tablet TAKE (1) TABLET BY MOUTH  ONCE DAILY. 07/02/12   Kerri Perches, MD  lovastatin (MEVACOR) 40 MG tablet TAKE 2 TABLETS BY MOUTH AT BEDTIME FOR CHOLESTEROL. 07/02/12   Kerri Perches, MD  omeprazole (PRILOSEC) 20 MG capsule Take 1 capsule (20 mg total) by mouth 2 (two) times daily. 07/02/12   Kerri Perches, MD  paricalcitol [facility]) 1 MCG capsule One capsule Monday, Wednesday and friday 07/02/12   Kerri Perches, MD  predniSONE (DELTASONE) 5 MG tablet Take 1 tablet (5 mg total) by mouth 2 (two) times daily. 09/20/12 09/27/12  Kerri Perches, MD  sodium bicarbonate 325 MG tablet Take 325 mg by mouth 2 (two) times daily.     Historical Provider, MD  triamcinolone cream  (KENALOG) 0.5 % APPLY TO THE AFFECTED AREAS SPARINGLY TWICE DAILY AS NEEDED. 10/01/11   Kerri Perches, MD  triamterene-hydrochlorothiazide [facility]) 75-50 MG per tablet One half tablet by mouth once a day 07/02/12   Kerri Perches, MD   Physical Exam: Filed Vitals:   09/25/12 1415 09/25/12 1553 09/25/12 1605 09/25/12 1650  BP:  139/71 140/73 138/64  Pulse:  90 88 88  Temp:    97.7 F (36.5 C)  TempSrc:    Oral  Resp:  22 19 20   Height:    4\' 9"  (1.448 m)  Weight:    114.5 kg (252 lb 6.8 oz)  SpO2: 97% 96% 95% 96%     General:  No acute distress   Eyes: Pupils are equal round react to light  ENT: Mucous membranes are moist  Neck: Supple  Cardiovascular: S1, S2, regular rate and rhythm  Respiratory: Bilateral rales  Abdomen: Soft, obese, nontender, positive bowel sounds  Skin: No rashes  Musculoskeletal: 1+ pedal edema bilaterally  Psychiatric: Normal affect, cooperative with exam  Neurologic: Grossly intact, nonfocal  Labs on Admission:  Basic Metabolic Panel:  Recent Labs Lab 09/25/12 1406 09/25/12 1454  NA 137  --   K 5.2*  --   CL 101  --   CO2 21  --   GLUCOSE 107*  --   BUN 58*  --   CREATININE 6.72*  --   CALCIUM 5.3*  --   MG  --  0.9*   Liver Function Tests:  Recent Labs Lab 09/25/12 1454  AST 24  ALT 17  ALKPHOS 92  BILITOT 0.1*  PROT 6.9  ALBUMIN 2.9*   No results found for this basename: LIPASE, AMYLASE,  in the last 168 hours No results found for this basename: AMMONIA,  in the last 168 hours CBC:  Recent Labs Lab 09/25/12 1406  WBC 10.6*  NEUTROABS 8.5*  HGB 8.3*  HCT 25.8*  MCV 82.2  PLT 320   Cardiac Enzymes:  Recent Labs Lab 09/25/12 1406  TROPONINI <0.30    BNP (last 3 results)  Recent Labs  09/25/12 1406  PROBNP 4794.0*   CBG:  Recent Labs Lab 09/25/12 1704  GLUCAP 102*    Radiological Exams on Admission: Dg Chest Portable 1 View  09/25/2012   *RADIOLOGY REPORT*  Clinical Data: Shortness  of breath, wheezing  PORTABLE CHEST - 1 VIEW  Comparison: 02/04/2010  Findings: Cardiomegaly with suspected mild interstitial edema.  Left lung base is poorly visualized.  Underlying atelectasis and/or effusion is difficult to exclude.  No pneumothorax.  IMPRESSION: Cardiomegaly with suspected mild interstitial edema.   Original Report Authenticated By: Charline Bills, M.D.    EKG: Independently reviewed. Prolonged QT, no acute ST-T changes  Assessment/Plan  Active Problems:   OBESITY   HYPERTENSION   ESRD (end stage renal disease)   Hypocalcemia   Hypomagnesemia   Prolonged Q-T interval on ECG   Acute on chronic diastolic CHF (congestive heart failure)   Anemia in chronic kidney disease   1. Acute on chronic diastolic congestive heart failure. Last echo shows normal ejection fraction with some diastolic dysfunction. This is likely due to noncompliance with fluid/salt intake. We will start the patient on IV Lasix. Monitor ins and outs and daily weights. 2. End-stage renal disease. Patient continues to decline hemodialysis. Will monitor renal function closely. 3. Hypocalcemia/hypomagnesemia. We'll replace. Check vitamin D levels. 4. Prolonged QT interval. Possibly related to electrolyte abnormalities. Will repeat in the morning. 5. Anemia of chronic disease. Appears to be at baseline. Patient receives Epogen injections in the hematology clinic.  Code Status: DNR, confirmed with patient Family Communication: discussed with patient and family at the bedside Disposition Plan: discharge home once improved  Time spent:  MEMON,JEHANZEB [facility] Pager [phone]  If 7PM-7AM, please contact night-coverage [email or web address] Password [facility] 09/25/2012, 7:22 PM

## 2012-09-26 LAB — BASIC METABOLIC PANEL
BUN: 62 mg/dL — ABNORMAL HIGH (ref 6–23)
Creatinine, Ser: 7.15 mg/dL — ABNORMAL HIGH (ref 0.50–1.10)
GFR calc non Af Amer: 5 mL/min — ABNORMAL LOW (ref >90)
Glucose, Bld: 87 mg/dL (ref 70–99)
Potassium: 5.1 mEq/L (ref 3.5–5.1)

## 2012-09-26 MED ORDER — CALCIUM CARBONATE-VITAMIN D 500-200 MG-UNIT PO TABS
1.0000 | ORAL_TABLET | Freq: Three times a day (TID) | ORAL | Status: DC
  Administered 2012-09-26 – 2012-09-28 (×7): 1 via ORAL
  Filled 2012-09-26 (×13): qty 1

## 2012-09-26 MED ORDER — SODIUM CHLORIDE 0.9 % IV SOLN
1.0000 g | Freq: Once | INTRAVENOUS | Status: AC
  Administered 2012-09-26: 1 g via INTRAVENOUS
  Filled 2012-09-26: qty 10

## 2012-09-26 MED ORDER — DIPHENHYDRAMINE HCL 25 MG PO CAPS: 25.0000 mg | ORAL_CAPSULE | Freq: Every evening | ORAL | Status: DC | PRN

## 2012-09-26 MED ORDER — ZOLPIDEM TARTRATE 5 MG PO TABS
5.0000 mg | ORAL_TABLET | Freq: Every evening | ORAL | Status: DC | PRN
  Administered 2012-09-26: 5 mg via ORAL
  Filled 2012-09-26: qty 1

## 2012-09-26 MED ORDER — CALCIUM GLUCONATE 10 % IV SOLN
1.0000 g | Freq: Once | INTRAVENOUS | Status: AC
  Administered 2012-09-26: 1 g via INTRAVENOUS
  Filled 2012-09-26: qty 10

## 2012-09-26 MED ORDER — SODIUM BICARBONATE 650 MG PO TABS
1300.0000 mg | ORAL_TABLET | Freq: Two times a day (BID) | ORAL | Status: DC
Start: 2012-09-26 — End: 2012-09-28
  Administered 2012-09-26 – 2012-09-28 (×5): 1300 mg via ORAL
  Filled 2012-09-26 (×5): qty 2

## 2012-09-26 MED ORDER — ATORVASTATIN CALCIUM 20 MG PO TABS
20.0000 mg | ORAL_TABLET | Freq: Every day | ORAL | Status: DC
  Administered 2012-09-26 – 2012-09-27 (×2): 20 mg via ORAL
  Filled 2012-09-26 (×2): qty 1

## 2012-09-26 NOTE — Progress Notes (Signed)
CRITICAL VALUE ALERT  Critical value received:  Calcium level (blood) 6.0  Date of notification:  September 26, 2012  Time of notification:  0751  Critical value read back: yes  Nurse who received alert:  Zannie Kehr  MD notified (1st page):  Memon  Time of first page:  [phone]  MD notified (2nd page):  Time of second page:  Responding MD:  Kerry Hough  Time MD responded:  [phone]  New orders given and followed.

## 2012-09-26 NOTE — Progress Notes (Signed)
[facility] PROGRESS NOTE  Terri Kelly GNF:[id number] DOB: [birth year] DOA: 09/25/2012 PCP: [name], MD  Assessment/Plan: 1. Acute on chronic diastolic congestive heart failure. Good urine output with IV Lasix. Symptomatically improving. Continue current treatments. 2. End-stage renal disease. Renal function is stable. 3. Hypocalcemia. Replace. Follow vitamin D levels. 4. Prolonged QT interval. Repeat EKG this morning shows that QT interval still prolonged, but shorter than yesterday 5. Anemia of chronic disease. At baseline.  Code Status: DNR Family Communication: discussed with patient and family at the bedside Disposition Plan: discharge home once improved   Consultants:  none  Procedures:  none  Antibiotics:  none  HPI/Subjective: Feeling better today. No new complaints, breathing slowly improving  Objective: Filed Vitals:   09/25/12 2008 09/26/12 0414 09/26/12 0536 09/26/12 0843  BP: 134/63  135/61   Pulse: 89  91 99  Temp: 97.8 F (36.6 C)  97.9 F (36.6 C)   TempSrc: Oral  Oral   Resp: 20  20   Height:      Weight:  118 kg (260 lb 2.3 oz)    SpO2: 97%  97% 94%    Intake/Output Summary (Last 24 hours) at 09/26/12 1216 Last data filed at 09/26/12 0800  Gross per 24 hour  Intake    880 ml  Output   2775 ml  Net  -1895 ml   Filed Weights   09/25/12 1345 09/25/12 1650 09/26/12 0414  Weight: 97.977 kg (216 lb) 114.5 kg (252 lb 6.8 oz) 118 kg (260 lb 2.3 oz)    Exam:   General:  NAD  Cardiovascular: S1, S2, RRR  Respiratory: crackles at bases  Abdomen: soft, nt, nd, bs+  Musculoskeletal: 1+ edema b/l   Data Reviewed: Basic Metabolic Panel:  Recent Labs Lab 09/25/12 1406 09/25/12 1454 09/26/12 0600  NA 137  --  139  K 5.2*  --  5.1  CL 101  --  101  CO2 21  --  23  GLUCOSE 107*  --  87  BUN 58*  --  62*  CREATININE 6.72*  --  7.15*  CALCIUM 5.3*  --  6.0*  MG  --  0.9*  --    Liver Function Tests:  Recent  Labs Lab 09/25/12 1454  AST 24  ALT 17  ALKPHOS 92  BILITOT 0.1*  PROT 6.9  ALBUMIN 2.9*   No results found for this basename: LIPASE, AMYLASE,  in the last 168 hours No results found for this basename: AMMONIA,  in the last 168 hours CBC:  Recent Labs Lab 09/25/12 1406  WBC 10.6*  NEUTROABS 8.5*  HGB 8.3*  HCT 25.8*  MCV 82.2  PLT 320   Cardiac Enzymes:  Recent Labs Lab 09/25/12 1406  TROPONINI <0.30   BNP (last 3 results)  Recent Labs  09/25/12 1406  PROBNP 4794.0*   CBG:  Recent Labs Lab 09/25/12 1704 09/25/12 2004  GLUCAP 102* 133*    No results found for this or any previous visit (from the past 240 hour(s)).   Studies: Dg Chest Portable 1 View  09/25/2012   *RADIOLOGY REPORT*  Clinical Data: Shortness of breath, wheezing  PORTABLE CHEST - 1 VIEW  Comparison: 02/04/2010  Findings: Cardiomegaly with suspected mild interstitial edema.  Left lung base is poorly visualized.  Underlying atelectasis and/or effusion is difficult to exclude.  No pneumothorax.  IMPRESSION: Cardiomegaly with suspected mild interstitial edema.   Original Report Authenticated By: Charline Bills, M.D.    Scheduled Meds: .  amLODipine  10 mg Oral Daily  . aspirin EC  81 mg Oral Daily  . atorvastatin  20 mg Oral q1800  . enoxaparin (LOVENOX) injection  30 mg Subcutaneous Q24H  . furosemide  80 mg Intravenous Q12H  . hydrALAZINE  50 mg Oral BID  . labetalol  200 mg Oral BID  . levothyroxine  25 mcg Oral QAC breakfast  . magnesium sulfate  2 g Intravenous Once  . nystatin   Topical BID  . pantoprazole  40 mg Oral BID  . [START ON 09/27/2012] paricalcitol  1 mcg Oral 3 times weekly  . sodium bicarbonate  325 mg Oral BID  . sodium chloride  3 mL Intravenous Q12H   Continuous Infusions:   Active Problems:   OBESITY   HYPERTENSION   ESRD (end stage renal disease)   Hypocalcemia   Hypomagnesemia   Prolonged Q-T interval on ECG   Acute on chronic diastolic CHF (congestive  heart failure)   Anemia in chronic kidney disease    Time spent:    [facility]  [facility] Pager [phone]. If 7PM-7AM, please contact night-coverage at [email or web address], password [facility] 09/26/2012, 12:16 PM  LOS: 1 day

## 2012-09-27 ENCOUNTER — Ambulatory Visit (HOSPITAL_COMMUNITY): Payer: Medicare Other | Admitting: Oncology

## 2012-09-27 ENCOUNTER — Telehealth: Payer: Self-pay | Admitting: Family Medicine

## 2012-09-27 LAB — MAGNESIUM: Magnesium: 1.6 mg/dL (ref 1.5–2.5)

## 2012-09-27 LAB — URINE MICROSCOPIC-ADD ON

## 2012-09-27 LAB — BASIC METABOLIC PANEL
CO2: 23 mEq/L (ref 19–32)
Calcium: 6.7 mg/dL — ABNORMAL LOW (ref 8.4–10.5)
GFR calc non Af Amer: 5 mL/min — ABNORMAL LOW (ref >90)
Sodium: 143 mEq/L (ref 135–145)

## 2012-09-27 LAB — URINALYSIS, ROUTINE W REFLEX MICROSCOPIC
Nitrite: NEGATIVE
Protein, ur: 100 mg/dL — AB
Specific Gravity, Urine: 1.02 (ref 1.005–1.030)
Urobilinogen, UA: 0.2 mg/dL (ref 0.0–1.0)

## 2012-09-27 LAB — HEPATIC FUNCTION PANEL
AST: 16 U/L (ref 0–37)
Bilirubin, Direct: <0.1 mg/dL (ref 0.0–0.3)

## 2012-09-27 LAB — LIPASE, BLOOD: Lipase: 23 U/L (ref 11–59)

## 2012-09-27 MED ORDER — MORPHINE SULFATE 2 MG/ML IJ SOLN
1.0000 mg | Freq: Once | INTRAMUSCULAR | Status: AC
  Administered 2012-09-27: 1 mg via INTRAVENOUS
  Filled 2012-09-27: qty 1

## 2012-09-27 MED ORDER — SODIUM CHLORIDE 0.9 % IV SOLN
1.0000 g | Freq: Once | INTRAVENOUS | Status: AC
  Administered 2012-09-27: 1 g via INTRAVENOUS
  Filled 2012-09-27: qty 10

## 2012-09-27 MED ORDER — MORPHINE SULFATE 2 MG/ML IJ SOLN
2.0000 mg | INTRAMUSCULAR | Status: DC | PRN
  Administered 2012-09-27: 2 mg via INTRAVENOUS
  Filled 2012-09-27: qty 1

## 2012-09-27 MED ORDER — GI COCKTAIL ~~LOC~~
30.0000 mL | Freq: Once | ORAL | Status: AC
  Administered 2012-09-27: 30 mL via ORAL
  Filled 2012-09-27: qty 30

## 2012-09-27 MED ORDER — EPOETIN ALFA 40000 UNIT/ML IJ SOLN
40000.0000 [IU] | Freq: Once | INTRAMUSCULAR | Status: AC
  Administered 2012-09-27: 40000 [IU] via SUBCUTANEOUS
  Filled 2012-09-27: qty 1

## 2012-09-27 MED ORDER — CYCLOBENZAPRINE HCL 10 MG PO TABS
5.0000 mg | ORAL_TABLET | Freq: Three times a day (TID) | ORAL | Status: DC | PRN
  Administered 2012-09-27 (×2): 5 mg via ORAL
  Filled 2012-09-27 (×2): qty 1

## 2012-09-27 NOTE — Progress Notes (Signed)
[facility] PROGRESS NOTE  Terri Kelly AOZ:[id number] DOB: 1936-02-08 DOA: 09/25/2012 PCP: [name], MD  Assessment/Plan: Acute on chronic diastolic congestive heart failure. Good urine output with IV Lasix. Symptomatically improving. Her net fluid balance is -4.5L.  Renal function appears to be tolerating this. Continue current treatments. 1. End-stage renal disease. Renal function is stable. 2. Hypocalcemia. Replace. Follow vitamin D levels. Give another dose of calcium gluconate today. 3. Prolonged QT interval. Repeat EKG this morning shows that QT interval is normalized 4. Anemia of chronic disease. At baseline. She is due for weekly procrit injection today.  We will order one time dose. 5. Right sided pain.  EKG is unchanged.  Will order lipase and hepatic function panel.  Could be related to muscle strain since it is worse with any movement.  Will give morphine and flexeril.  Code Status: DNR Family Communication: discussed with patient and family at the bedside Disposition Plan: discharge home once improved   Consultants:  none  Procedures:  none  Antibiotics:  none  HPI/Subjective: Complaining of right upper quadrant, right flank pain since last night. Feels breathing is doing pretty good.  Objective: Filed Vitals:   09/26/12 1536 09/26/12 2046 09/27/12 0412 09/27/12 0522  BP: 137/60 143/72  164/72  Pulse: 100 94  97  Temp: 98.1 F (36.7 C) 98.3 F (36.8 C)  99.7 F (37.6 C)  TempSrc: Oral Oral  Oral  Resp: 20 20  20   Height:      Weight:   115 kg (253 lb 8.5 oz)   SpO2: 99% 98%  96%    Intake/Output Summary (Last 24 hours) at 09/27/12 1008 Last data filed at 09/27/12 0700  Gross per 24 hour  Intake    240 ml  Output   2880 ml  Net  -2640 ml   Filed Weights   09/25/12 1650 09/26/12 0414 09/27/12 0412  Weight: 114.5 kg (252 lb 6.8 oz) 118 kg (260 lb 2.3 oz) 115 kg (253 lb 8.5 oz)    Exam:   General:  NAD  Cardiovascular: S1, S2,  RRR  Respiratory: crackles at bases  Abdomen: soft, tenderness in right upper quadrant, nd, bs+  Musculoskeletal: 1+ edema b/l   Data Reviewed: Basic Metabolic Panel:  Recent Labs Lab 09/25/12 1406 09/25/12 1454 09/26/12 0600 09/27/12 0556  NA 137  --  139 143  K 5.2*  --  5.1 4.7  CL 101  --  101 103  CO2 21  --  23 23  GLUCOSE 107*  --  87 105*  BUN 58*  --  62* 62*  CREATININE 6.72*  --  7.15* 7.18*  CALCIUM 5.3*  --  6.0* 6.7*  MG  --  0.9*  --  1.6   Liver Function Tests:  Recent Labs Lab 09/25/12 1454  AST 24  ALT 17  ALKPHOS 92  BILITOT 0.1*  PROT 6.9  ALBUMIN 2.9*   No results found for this basename: LIPASE, AMYLASE,  in the last 168 hours No results found for this basename: AMMONIA,  in the last 168 hours CBC:  Recent Labs Lab 09/25/12 1406  WBC 10.6*  NEUTROABS 8.5*  HGB 8.3*  HCT 25.8*  MCV 82.2  PLT 320   Cardiac Enzymes:  Recent Labs Lab 09/25/12 1406  TROPONINI <0.30   BNP (last 3 results)  Recent Labs  09/25/12 1406  PROBNP 4794.0*   CBG:  Recent Labs Lab 09/25/12 1704 09/25/12 2004  GLUCAP 102* 133*  No results found for this or any previous visit (from the past 240 hour(s)).   Studies: Dg Chest Portable 1 View  09/25/2012   *RADIOLOGY REPORT*  Clinical Data: Shortness of breath, wheezing  PORTABLE CHEST - 1 VIEW  Comparison: 02/04/2010  Findings: Cardiomegaly with suspected mild interstitial edema.  Left lung base is poorly visualized.  Underlying atelectasis and/or effusion is difficult to exclude.  No pneumothorax.  IMPRESSION: Cardiomegaly with suspected mild interstitial edema.   Original Report Authenticated By: Charline Bills, M.D.    Scheduled Meds: . amLODipine  10 mg Oral Daily  . aspirin EC  81 mg Oral Daily  . atorvastatin  20 mg Oral q1800  . calcium-vitamin D  1 tablet Oral TID  . enoxaparin (LOVENOX) injection  30 mg Subcutaneous Q24H  . furosemide  80 mg Intravenous Q12H  . hydrALAZINE  50 mg  Oral BID  . labetalol  200 mg Oral BID  . levothyroxine  25 mcg Oral QAC breakfast  . magnesium sulfate  2 g Intravenous Once  .  morphine injection  1 mg Intravenous Once  . nystatin   Topical BID  . pantoprazole  40 mg Oral BID  . paricalcitol  1 mcg Oral 3 times weekly  . sodium bicarbonate  1,300 mg Oral BID  . sodium chloride  3 mL Intravenous Q12H   Continuous Infusions:   Active Problems:   OBESITY   HYPERTENSION   ESRD (end stage renal disease)   Hypocalcemia   Hypomagnesemia   Prolonged Q-T interval on ECG   Acute on chronic diastolic CHF (congestive heart failure)   Anemia in chronic kidney disease    Time spent:    [facility]  [facility] Pager [phone]. If 7PM-7AM, please contact night-coverage at [email or web address], password [facility] 09/27/2012, 10:08 AM  LOS: 2 days

## 2012-09-27 NOTE — Telephone Encounter (Signed)
noted 

## 2012-09-27 NOTE — Progress Notes (Signed)
Utilization Review Complete  

## 2012-09-27 NOTE — Progress Notes (Signed)
Text paged Dr. Kerry Hough that the patient did not have any relief with the GI cocktail.  She still rates her pain at a 8.

## 2012-09-27 NOTE — Progress Notes (Addendum)
Pt c/o of right upper flank pain.  She rates at a 8.  She stated that she can not take a deep breathe.  She stated that she believe it is gas.   I reported her concerns to Dr. Kerry Hough.  New orders placed and followed.  MD also stated he will be up to see the patient soon.

## 2012-09-28 LAB — CBC
Hemoglobin: 8.7 g/dL — ABNORMAL LOW (ref 12.0–15.0)
MCH: 26.1 pg (ref 26.0–34.0)
MCHC: 32.1 g/dL (ref 30.0–36.0)
MCV: 81.4 fL (ref 78.0–100.0)

## 2012-09-28 LAB — BASIC METABOLIC PANEL
BUN: 63 mg/dL — ABNORMAL HIGH (ref 6–23)
CO2: 25 mEq/L (ref 19–32)
Calcium: 7.3 mg/dL — ABNORMAL LOW (ref 8.4–10.5)
GFR calc non Af Amer: 5 mL/min — ABNORMAL LOW (ref >90)
Glucose, Bld: 114 mg/dL — ABNORMAL HIGH (ref 70–99)
Potassium: 4.6 mEq/L (ref 3.5–5.1)

## 2012-09-28 MED ORDER — CALCIUM CARBONATE-VITAMIN D 500-200 MG-UNIT PO TABS: 1.0000 | ORAL_TABLET | Freq: Three times a day (TID) | ORAL | Status: DC

## 2012-09-28 MED ORDER — LABETALOL HCL 200 MG PO TABS: 200.0000 mg | ORAL_TABLET | Freq: Two times a day (BID) | ORAL | Status: DC

## 2012-09-28 MED ORDER — FUROSEMIDE 80 MG PO TABS: 80.0000 mg | ORAL_TABLET | Freq: Two times a day (BID) | ORAL | Status: DC

## 2012-09-28 NOTE — Care Management Note (Unsigned)
    Page 1 of 1   09/28/2012     2:34:08 PM   CARE MANAGEMENT NOTE 09/28/2012  Patient:  Terri Kelly, Terri Kelly   Account Number:  [id number]  Date Initiated:  09/28/2012  Documentation initiated by:  Rosemary Holms  Subjective/Objective Assessment:   Pt admitted from home. Her daughters live with her. Plan to DC home with [facility]. O2 weaned.t     Action/Plan:   Anticipated DC Date:  09/28/2012   Anticipated DC Plan:  HOME W HOME HEALTH SERVICES      DC Planning Services  CM consult      Choice offered to / List presented to:          [facility] arranged  HH-1 RN  HH-10 DISEASE MANAGEMENT  HH-2 PT  HH-4 NURSE'S AIDE      HH agency  [facility]   Status of service:  In process, will continue to follow Medicare Important Message given?   (If response is "NO", the following Medicare IM given date fields will be blank) Date Medicare IM given:   Date Additional Medicare IM given:    Discharge Disposition:    Per UR Regulation:    If discussed at Long Length of Stay Meetings, dates discussed:    Comments:  09/28/12 Rosemary Holms RN BSN CM

## 2012-09-28 NOTE — Progress Notes (Signed)
Did walking O2 saturations with pt.  At rest on RA pt was satting at 94%  Standing up on RA pt was satting at 92%  Walking on RA pt dropped to 87%, but with some deep breaths, she was able to get back to 92%.  Pt experienced some shortness of breath, but was able to recover her oxygen saturations to the mid 90s on RA with some deep breaths. She stabilized her saturations in the mid 90s after ambulation on RA.

## 2012-09-28 NOTE — Progress Notes (Signed)
Pt is to be discharged home today. Pt is in NAD, IV is out, all paperwork has been reviewed/discussed with patient, and there are no questions/concerns at this time. Assessment is unchanged from this morning. Pt is to be accompanied downstairs by staff and family via wheelchair.  

## 2012-09-28 NOTE — Discharge Summary (Signed)
Physician Discharge Summary  Terri Kelly ZOX:[id number] DOB: 01-12-1936 DOA: 09/25/2012  PCP: [name], MD  Admit date: 09/25/2012 Discharge date: 09/28/2012  Time spent: 45 minutes  Recommendations for Outpatient Follow-up:  1. Patient has been set up with home health RN/PT 2. Have the BMP drawn on 7/11 with results sent to primary nephrologist 3. Followup nephrologist next week 4. Followup with primary care physician in 2 weeks.  Discharge Diagnoses:  Active Problems:   OBESITY   HYPERTENSION   ESRD (end stage renal disease)   Hypocalcemia   Hypomagnesemia   Prolonged Q-T interval on ECG   Acute on chronic diastolic CHF (congestive heart failure)   Anemia in chronic kidney disease   Discharge Condition: Improved  Diet recommendation: Low-salt  Filed Weights   09/27/12 0412 09/28/12 0418 09/28/12 0500  Weight: 115 kg (253 lb 8.5 oz) 111.6 kg (246 lb 0.5 oz) 111.6 kg (246 lb 0.5 oz)    History of present illness:  Terri Kelly is a 77 y.o. female with a history of end-stage renal disease who has declined progressing to dialysis. She presents to the emergency room today with complaints of shortness of breath. She describes onset of symptoms occurred approximately 2 days ago. She admits to worsening dyspnea on exertion, orthopnea. Family is also noted progressive ankle swelling over the past 2 days. Patient is relatively noncompliant with fluid intake as well as salt intake. She's not on a diuretic at home. She denies any chest pain, fever, cough, vomiting, diarrhea, dysuria, abdominal pain or any other complaints. She was evaluated in the emergency room and noted to be volume overload. She was also noted to be significantly hypocalcemic/hypomagnesemia. She's been referred for admission.   Hospital Course:  This lady was admitted to the hospital with progressive shortness of breath. She she was found to be in acute on chronic diastolic congestive heart failure. She had  evidence of pulmonary edema as well as pedal edema. She also has known end-stage renal disease and has declined dialysis up until this point. The patient was started on high-dose IV Lasix and has had good urine output during her admission. Her fluid balance is -7.3 L. Her shortness of breath appears to have improved she is ambulating without the assistance of oxygen. Lower extremity edema has also improved. She feels that she is near her functional baseline as does her family. She was counseled on the importance of limiting salt intake as well as excessive fluid intake. She will be discharged home on by mouth Lasix. We will repeat a basic metabolic panel at the end of the week. She's been advised to followup with her nephrologist in the next one week. The patient is requesting discharge home. 2-D echocardiogram was not repeated. Last study was done in 2011, this can be repeated as an outpatient if desired. Hypercalcemia has also corrected with supplementation. QTC on EKG has also now normalized with correction of electrolytes. Patient has been set up home health services.  Procedures:  none  Consultations:  none  Discharge Exam: Filed Vitals:   09/27/12 2041 09/28/12 0418 09/28/12 0500 09/28/12 1416  BP: 157/68 153/72  139/56  Pulse: 90 80  81  Temp: 98.5 F (36.9 C) 97.7 F (36.5 C)  97.6 F (36.4 C)  TempSrc: Oral Oral  Oral  Resp: 20 20  20   Height:      Weight:  111.6 kg (246 lb 0.5 oz) 111.6 kg (246 lb 0.5 oz)   SpO2: 99% 98%  97%    General: No acute distress Cardiovascular: S1, S2, regular rate and rhythm Respiratory: Minimal crackles at bases  Discharge Instructions  Discharge Orders   Future Appointments Provider Department Dept Phone   10/26/2012 2:45 PM Kerri Perches, MD [facility] [phone]   Future Orders Complete By Expires     (HEART FAILURE PATIENTS) Call MD:  Anytime you have any of the following symptoms: 1) 3 pound weight gain in 24 hours or 5  pounds in 1 week 2) shortness of breath, with or without a dry hacking cough 3) swelling in the hands, feet or stomach 4) if you have to sleep on extra pillows at night in order to breathe.  As directed     Diet - low sodium heart healthy  As directed     Home Health  As directed     Comments:      Please have a home health RN draw BMET on 10/01/12 with results sent to Dr. Hyman Hopes (nephrologist)    Questions:      To provide the following care/treatments:  RN    PT    Increase activity slowly  As directed         Medication List    STOP taking these medications       predniSONE 5 MG tablet  Commonly known as:  DELTASONE     triamterene-hydrochlorothiazide 75-50 MG per tablet  Commonly known as:  MAXZIDE      TAKE these medications       amLODipine 10 MG tablet  Commonly known as:  NORVASC  Take 10 mg by mouth daily.     ASPIRIN LOW DOSE 81 MG EC tablet  Generic drug:  aspirin  Take 81 mg by mouth daily.     calcium-vitamin D 500-200 MG-UNIT per tablet  Commonly known as:  OSCAL WITH D  Take 1 tablet by mouth 3 (three) times daily.     diazepam 5 MG tablet  Commonly known as:  VALIUM  Take 5 mg by mouth 2 (two) times daily as needed for anxiety.     folic acid 1 MG tablet  Commonly known as:  FOLVITE  Take 1 tablet (1 mg total) by mouth daily.     furosemide 80 MG tablet  Commonly known as:  LASIX  Take 1 tablet (80 mg total) by mouth 2 (two) times daily.     hydrALAZINE 50 MG tablet  Commonly known as:  APRESOLINE  Take 50 mg by mouth 2 (two) times daily.     HYDROcodone-acetaminophen 7.5-325 MG per tablet  Commonly known as:  NORCO  Take 1 tablet by mouth 3 (three) times daily as needed for pain.     labetalol 200 MG tablet  Commonly known as:  NORMODYNE  Take 1 tablet (200 mg total) by mouth 2 (two) times daily.     levothyroxine 25 MCG tablet  Commonly known as:  SYNTHROID, LEVOTHROID  Take 25 mcg by mouth daily.     lovastatin 40 MG tablet  Commonly  known as:  MEVACOR  Take 80 mg by mouth at bedtime.     omeprazole 20 MG capsule  Commonly known as:  PRILOSEC  Take 1 capsule (20 mg total) by mouth 2 (two) times daily.     paricalcitol 1 MCG capsule  Commonly known as:  ZEMPLAR  One capsule Monday, Wednesday and friday     sodium bicarbonate 650 MG tablet  Take 1,300 mg by mouth  2 (two) times daily.       Allergies  Allergen Reactions  . Ace Inhibitors     REACTION: cough  . Angiotensin Receptor Blockers     REACTION: cough       Follow-up Information   Follow up with [name], MD. Schedule an appointment as soon as possible for a visit in 2 weeks.   Contact information:   [address], [address] [place] [address] [phone]       Follow up with Garnetta Buddy, MD. (follow up at the end of this week, or early next week)    Contact information:   [address] [place] [address] [phone]        The results of significant diagnostics from this hospitalization (including imaging, microbiology, ancillary and laboratory) are listed below for reference.    Significant Diagnostic Studies: Dg Chest Portable 1 View  09/25/2012   *RADIOLOGY REPORT*  Clinical Data: Shortness of breath, wheezing  PORTABLE CHEST - 1 VIEW  Comparison: 02/04/2010  Findings: Cardiomegaly with suspected mild interstitial edema.  Left lung base is poorly visualized.  Underlying atelectasis and/or effusion is difficult to exclude.  No pneumothorax.  IMPRESSION: Cardiomegaly with suspected mild interstitial edema.   Original Report Authenticated By: Charline Bills, M.D.    Microbiology: No results found for this or any previous visit (from the past 240 hour(s)).   Labs: Basic Metabolic Panel:  Recent Labs Lab 09/25/12 1406 09/25/12 1454 09/26/12 0600 09/27/12 0556 09/28/12 0559  NA 137  --  139 143 140  K 5.2*  --  5.1 4.7 4.6  CL 101  --  101 103 100  CO2 21  --  23 23 25   GLUCOSE 107*  --  87 105* 114*  BUN 58*   --  62* 62* 63*  CREATININE 6.72*  --  7.15* 7.18* 7.23*  CALCIUM 5.3*  --  6.0* 6.7* 7.3*  MG  --  0.9*  --  1.6  --    Liver Function Tests:  Recent Labs Lab 09/25/12 1454 09/27/12 0959  AST 24 16  ALT 17 12  ALKPHOS 92 92  BILITOT 0.1* 0.2*  PROT 6.9 6.8  ALBUMIN 2.9* 2.8*    Recent Labs Lab 09/27/12 0959  LIPASE 23   No results found for this basename: AMMONIA,  in the last 168 hours CBC:  Recent Labs Lab 09/25/12 1406 09/28/12 0559  WBC 10.6* 9.9  NEUTROABS 8.5*  --   HGB 8.3* 8.7*  HCT 25.8* 27.1*  MCV 82.2 81.4  PLT 320 298   Cardiac Enzymes:  Recent Labs Lab 09/25/12 1406  TROPONINI <0.30   BNP: BNP (last 3 results)  Recent Labs  09/25/12 1406  PROBNP 4794.0*   CBG:  Recent Labs Lab 09/25/12 1704 09/25/12 2004  GLUCAP 102* 133*       Signed:  MEMON,JEHANZEB  [facility] 09/28/2012, 4:39 PM

## 2012-09-29 LAB — VITAMIN D 1,25 DIHYDROXY: Vitamin D2 1, 25 (OH)2: <8 pg/mL

## 2012-10-04 ENCOUNTER — Telehealth: Payer: Self-pay | Admitting: Family Medicine

## 2012-10-04 ENCOUNTER — Other Ambulatory Visit: Payer: Self-pay | Admitting: Family Medicine

## 2012-10-04 ENCOUNTER — Other Ambulatory Visit: Payer: Self-pay

## 2012-10-04 MED ORDER — DIAZEPAM 5 MG PO TABS: ORAL_TABLET | ORAL | Status: DC

## 2012-10-04 MED ORDER — DIAZEPAM 5 MG PO TABS: 5.0000 mg | ORAL_TABLET | Freq: Two times a day (BID) | ORAL | Status: DC | PRN

## 2012-10-04 MED ORDER — HYDRALAZINE HCL 50 MG PO TABS: ORAL_TABLET | ORAL | Status: DC

## 2012-10-04 NOTE — Telephone Encounter (Signed)
noted 

## 2012-10-08 DIAGNOSIS — N039 Chronic nephritic syndrome with unspecified morphologic changes: Secondary | ICD-10-CM

## 2012-10-08 DIAGNOSIS — I5033 Acute on chronic diastolic (congestive) heart failure: Secondary | ICD-10-CM

## 2012-10-08 DIAGNOSIS — E109 Type 1 diabetes mellitus without complications: Secondary | ICD-10-CM

## 2012-10-08 DIAGNOSIS — I509 Heart failure, unspecified: Secondary | ICD-10-CM

## 2012-10-08 DIAGNOSIS — D631 Anemia in chronic kidney disease: Secondary | ICD-10-CM

## 2012-10-22 ENCOUNTER — Telehealth: Payer: Self-pay | Admitting: Family Medicine

## 2012-10-22 NOTE — Telephone Encounter (Signed)
Please schedule an appointment next week for her to come in, she needs a hospital follow up visit, med needs will be discussed and addressed at that time

## 2012-10-22 NOTE — Telephone Encounter (Signed)
Patient has appt scheduled for next week.  °

## 2012-10-22 NOTE — Telephone Encounter (Signed)
Noted  

## 2012-10-22 NOTE — Telephone Encounter (Signed)
Is it ok to send in furosemide and hydralazine?

## 2012-10-26 ENCOUNTER — Ambulatory Visit (HOSPITAL_COMMUNITY)
Admission: RE | Admit: 2012-10-26 | Discharge: 2012-10-26 | Disposition: A | Discharge status: Home / Self Care | Payer: Medicare Other | Source: Ambulatory Visit | Attending: Family Medicine | Admitting: Family Medicine

## 2012-10-26 ENCOUNTER — Encounter: Payer: Self-pay | Admitting: Family Medicine

## 2012-10-26 ENCOUNTER — Ambulatory Visit (INDEPENDENT_AMBULATORY_CARE_PROVIDER_SITE_OTHER): Payer: Medicare Other | Admitting: Family Medicine

## 2012-10-26 VITALS — BP 140/78 | HR 98 | Resp 16 | Ht 60.0 in | Wt 219.0 lb

## 2012-10-26 DIAGNOSIS — I509 Heart failure, unspecified: Secondary | ICD-10-CM

## 2012-10-26 DIAGNOSIS — R946 Abnormal results of thyroid function studies: Secondary | ICD-10-CM

## 2012-10-26 DIAGNOSIS — M25562 Pain in left knee: Secondary | ICD-10-CM | POA: Insufficient documentation

## 2012-10-26 DIAGNOSIS — R7309 Other abnormal glucose: Secondary | ICD-10-CM

## 2012-10-26 DIAGNOSIS — R5381 Other malaise: Secondary | ICD-10-CM

## 2012-10-26 DIAGNOSIS — K219 Gastro-esophageal reflux disease without esophagitis: Secondary | ICD-10-CM

## 2012-10-26 DIAGNOSIS — N186 End stage renal disease: Secondary | ICD-10-CM

## 2012-10-26 DIAGNOSIS — M25569 Pain in unspecified knee: Secondary | ICD-10-CM

## 2012-10-26 DIAGNOSIS — M1710 Unilateral primary osteoarthritis, unspecified knee: Secondary | ICD-10-CM | POA: Insufficient documentation

## 2012-10-26 DIAGNOSIS — R7302 Impaired glucose tolerance (oral): Secondary | ICD-10-CM

## 2012-10-26 DIAGNOSIS — M62838 Other muscle spasm: Secondary | ICD-10-CM

## 2012-10-26 DIAGNOSIS — I10 Essential (primary) hypertension: Secondary | ICD-10-CM

## 2012-10-26 DIAGNOSIS — I5033 Acute on chronic diastolic (congestive) heart failure: Secondary | ICD-10-CM

## 2012-10-26 DIAGNOSIS — Z139 Encounter for screening, unspecified: Secondary | ICD-10-CM

## 2012-10-26 DIAGNOSIS — E785 Hyperlipidemia, unspecified: Secondary | ICD-10-CM

## 2012-10-26 DIAGNOSIS — IMO0002 Reserved for concepts with insufficient information to code with codable children: Secondary | ICD-10-CM | POA: Insufficient documentation

## 2012-10-26 MED ORDER — FUROSEMIDE 80 MG PO TABS: 80.0000 mg | ORAL_TABLET | Freq: Two times a day (BID) | ORAL | Status: DC

## 2012-10-26 MED ORDER — HYDRALAZINE HCL 50 MG PO TABS: ORAL_TABLET | ORAL | Status: DC

## 2012-10-26 NOTE — Assessment & Plan Note (Signed)
Acute onset , sever and limiting mobility, xray and ortho eval asap

## 2012-10-26 NOTE — Assessment & Plan Note (Signed)
On replacement  Treatment , updated lab in September

## 2012-10-26 NOTE — Patient Instructions (Addendum)
F/u in 4 month, call if you need me before  Call for flu vaccine in early October  Please fasting lipid, TSH , and hBA1C in September when yoou are getting labs for Dr . Trenton Gammon of left knee today, and you are referred to orthopedics hopefully to be seen this week   Thankful that you are doing better than you were before

## 2012-10-26 NOTE — Assessment & Plan Note (Signed)
Marked improvement. 

## 2012-10-26 NOTE — Assessment & Plan Note (Signed)
Has appt with nephrologist in September, has resisted dialysis to date

## 2012-10-26 NOTE — Assessment & Plan Note (Signed)
Encouraged low fat diet, updated lab in September

## 2012-10-26 NOTE — Assessment & Plan Note (Signed)
Currently well compensated and stable on current med regime

## 2012-10-26 NOTE — Progress Notes (Signed)
  Subjective:    Patient ID: Terri Kelly, female    DOB: 21-Sep-1935, 77 y.o.   MRN: [id number]  HPI Pt in for follow up from recent hospitalization. Neck pain and spasm is less , tough she still experiences this at times. No longer taking muscle relaxant Still grieving the recent loss of her spouse who had been terminally ill with lung cancer. Her children are right beside her both in support as well as with the grieving. States her appetite is good, bowel movements regular, denies urinary symptoms. States sleep is good Does report times when she cries out in  Grief, speaks to him, and she talks and laughs with the family about him. Had been receiving therapy at home up until last Friday for neck pain and generalized weakness, was doing well up until 3 days ago when she experienced sharp anterior left knee pain,and she has not been able to weigh bear since   Review of Systems See HPI Denies recent fever or chills. Denies sinus pressure, nasal congestion, ear pain or sore throat. Denies chest congestion, productive cough or wheezing. Denies chest pains, palpitations and leg swelling Denies abdominal pain, nausea, vomiting,diarrhea or constipation.   Denies dysuria, frequency, hesitancy or incontinence.  Denies headaches, seizures, numbness, or tingling.  Denies skin break down or rash.        Objective:   Physical Exam  Patient alert and oriented and in no cardiopulmonary distress.  HEENT: No facial asymmetry, EOMI, no sinus tenderness,  oropharynx pink and moist.  Neck decreased though adequate ROM, no adenopathy.  Chest: Clear to auscultation bilaterally.  CVS: S1, S2 no murmurs, no S3.  ABD: Soft non tender. Bowel sounds normal.  Ext: No edema  MS: decreased ROM spine, shoulders, hips and knees.Swelling , tenderness across patellar and limitation in movement of left knee  Skin: Intact, no ulcerations or rash noted.  Psych: Good eye contact, normal affect. Memory  intact not anxious or depressed appearing.  CNS: CN 2-12 intact, power,  normal throughout.       Assessment & Plan:

## 2012-10-26 NOTE — Assessment & Plan Note (Signed)
Controlled, no change in medication  

## 2012-10-26 NOTE — Assessment & Plan Note (Signed)
HBA1C to be checked in September with next lab draw

## 2012-10-28 ENCOUNTER — Telehealth: Payer: Self-pay | Admitting: Family Medicine

## 2012-10-28 ENCOUNTER — Ambulatory Visit: Payer: Medicare Other | Admitting: Orthopedic Surgery

## 2012-10-28 ENCOUNTER — Ambulatory Visit (INDEPENDENT_AMBULATORY_CARE_PROVIDER_SITE_OTHER): Payer: Medicare Other | Admitting: Orthopedic Surgery

## 2012-10-28 ENCOUNTER — Encounter: Payer: Self-pay | Admitting: Orthopedic Surgery

## 2012-10-28 VITALS — BP 135/71 | Ht 60.0 in | Wt 219.0 lb

## 2012-10-28 DIAGNOSIS — M25562 Pain in left knee: Secondary | ICD-10-CM

## 2012-10-28 DIAGNOSIS — M179 Osteoarthritis of knee, unspecified: Secondary | ICD-10-CM

## 2012-10-28 DIAGNOSIS — M1710 Unilateral primary osteoarthritis, unspecified knee: Secondary | ICD-10-CM

## 2012-10-28 DIAGNOSIS — M25569 Pain in unspecified knee: Secondary | ICD-10-CM

## 2012-10-28 NOTE — Progress Notes (Signed)
Patient ID: Terri Kelly, female   DOB: 11-20-1935, 77 y.o.   MRN: [id number] Chief Complaint  Patient presents with  . Knee Pain    Left knee pain, no injury    Severe knee pain times a week left knee pain for several years. Recent onset of pain caused her to have difficulty ambulating she lost the ability to weight-bear she complains of burning 10 out of 10 intermittent knee pain with tingling catching swelling occult ambulating and getting in and out of a chair  History weight gain shortness of breath snoring joint pain swelling instability kidney failure heat and cold intolerance allergy to IVP dye remaining systems are negative  Her creatinine is 7  History of CHF, diabetes, hypertension, thyroid disease, high cholesterol and kidney failure  Previous right knee arthroplasty and previous hysterectomy  Family history heart disease arthritis cancer diabetes kidney disease  Retired does not smoke  BP 135/71  Ht 5' (1.524 m)  Wt 219 lb (99.338 kg)  BMI 42.77 kg/m2 General appearance is normal, the patient is alert and oriented x3 with normal mood and affect. Mesomorphic body habitus extremely labored gait  However she does have full extension power in her knee her flexion is about 100 limited by the size of her leg and her obesity her knee is stable strength is normal scans intact she's tender along the joint lines she is a good distal pulse sensation is normal there is no lymphangitis there are no pathologic reflexes  Her x-ray prior to the office visit show severe arthritis of the knee requiring knee replacement for relief of pain and function however she would just like an injection  Encounter Diagnoses  Name Primary?  . Knee pain, left Yes  . OA (osteoarthritis) of knee     Knee  Injection Procedure Note  Pre-operative Diagnosis: left knee oa  Post-operative Diagnosis: same  Indications: pain  Anesthesia: ethyl chloride   Procedure Details   Verbal consent was  obtained for the procedure. Time out was completed.The joint was prepped with alcohol, followed by  Ethyl chloride spray and A 20 gauge needle was inserted into the knee via lateral approach; 4ml 1% lidocaine and 1 ml of depomedrol  was then injected into the joint . The needle was removed and the area cleansed and dressed.  Complications:  None; patient tolerated the procedure well.

## 2012-10-28 NOTE — Patient Instructions (Addendum)
Recommend total knee replacement

## 2012-10-29 NOTE — Telephone Encounter (Signed)
Are you aware of this?  Please advise.

## 2012-11-08 ENCOUNTER — Telehealth: Payer: Self-pay | Admitting: Family Medicine

## 2012-11-08 MED ORDER — HYDRALAZINE HCL 50 MG PO TABS: ORAL_TABLET | ORAL | Status: DC

## 2012-11-08 MED ORDER — FUROSEMIDE 80 MG PO TABS: 80.0000 mg | ORAL_TABLET | Freq: Two times a day (BID) | ORAL | Status: DC

## 2012-11-08 NOTE — Telephone Encounter (Signed)
1 month sent in of both meds to the local pharmacy while she waits on the shipment of her mailorder meds

## 2012-11-15 ENCOUNTER — Telehealth: Payer: Self-pay | Admitting: Family Medicine

## 2012-11-15 ENCOUNTER — Other Ambulatory Visit: Payer: Self-pay

## 2012-11-15 MED ORDER — HYDROCODONE-ACETAMINOPHEN 7.5-325 MG PO TABS: 1.0000 | ORAL_TABLET | Freq: Three times a day (TID) | ORAL | Status: DC | PRN

## 2012-11-15 NOTE — Telephone Encounter (Signed)
Med refilled.  Awaiting signature.

## 2012-12-06 ENCOUNTER — Telehealth: Payer: Self-pay | Admitting: Family Medicine

## 2012-12-06 NOTE — Telephone Encounter (Signed)
Hematologist had taken responsibility for administration locally , pls call over there and see if this is what is happening. I have not been ordering the procrit and do not plan to be ordering it, need some follow through on this, pls get back to me

## 2012-12-07 NOTE — Telephone Encounter (Signed)
Daughter Omega aware

## 2012-12-10 ENCOUNTER — Encounter (HOSPITAL_COMMUNITY): Payer: Self-pay

## 2012-12-10 ENCOUNTER — Encounter (HOSPITAL_COMMUNITY): Payer: Medicare Other | Attending: Hematology and Oncology

## 2012-12-10 VITALS — BP 135/63 | HR 75 | Temp 97.2°F | Resp 18 | Wt 220.5 lb

## 2012-12-10 DIAGNOSIS — D631 Anemia in chronic kidney disease: Secondary | ICD-10-CM

## 2012-12-10 DIAGNOSIS — N19 Unspecified kidney failure: Secondary | ICD-10-CM

## 2012-12-10 DIAGNOSIS — D649 Anemia, unspecified: Secondary | ICD-10-CM

## 2012-12-10 DIAGNOSIS — N186 End stage renal disease: Secondary | ICD-10-CM | POA: Insufficient documentation

## 2012-12-10 DIAGNOSIS — I5033 Acute on chronic diastolic (congestive) heart failure: Secondary | ICD-10-CM

## 2012-12-10 MED ORDER — EPOETIN ALFA 40000 UNIT/ML IJ SOLN: 40000.0000 [IU] | Freq: Once | INTRAMUSCULAR | Status: DC

## 2012-12-10 MED ORDER — EPOETIN ALFA 40000 UNIT/ML IJ SOLN
40000.0000 [IU] | Freq: Once | INTRAMUSCULAR | Status: AC
  Administered 2012-12-10: 40000 [IU] via SUBCUTANEOUS
  Filled 2012-12-10: qty 1

## 2012-12-10 NOTE — Progress Notes (Signed)
Terri Kelly presented for labwork. Labs per MD order drawn via Peripheral Line 236 gauge needle inserted in right AC  Good blood return present. Procedure without incident.  Needle removed intact. Patient tolerated procedure well. Terri Kelly presents today for injection per MD orders. Procrit 40,000 units administered SQ in right Abdomen. Administration without incident. Patient tolerated well.

## 2012-12-10 NOTE — Progress Notes (Signed)
[facility] OFFICE PROGRESS NOTE  [place], [place] [address] [place] [address]  DIAGNOSIS: ESRD (end stage renal disease) - Plan: Transferrin Receptor, Soluable  Anemia in chronic kidney disease - Plan: Transferrin Receptor, Soluable  Acute on chronic diastolic CHF (congestive heart failure)  Chief Complaint  Patient presents with  . Follow-up    CURRENT THERAPY: None since July 20 14th  INTERVAL HISTORY: Terri Kelly 77 y.o. female returns for followup of anemia in association with chronic renal failure with last creatinine over 80. Patient has refused dialysis. She continues to be followed by the renal service who documented a hemoglobin of 7.6 on 11/30/2012. Patient denies any worsening symptoms of shortness of breath or fatigue. She also denies any chest pain, PND, orthopnea, palpitations, or significant lower extremity swelling. She denies any cough, wheezing, or difficulty initiating urination. She also denies epistaxis, melena, hematochezia, hematuria, vaginal bleeding, or dark urine. She has not been receiving any erythrocyte stimulating agents from nephrology. At  MEDICAL HISTORY: Past Medical History  Diagnosis Date  . Hyperlipidemia   . Obesity   . Hypertension   . Diabetes mellitus type 1   . Back pain   . Sleep apnea 2009    non compliant with c-pap  . Diastolic dysfunction Nov 2011    Hospitalised for 3 days at [facility]   . Normocytic anemia 04/09/2012  . Allergy   . Arthritis   . Chronic kidney disease     does not want HD    INTERIM HISTORY: has IGT (impaired glucose tolerance); HYPERLIPIDEMIA; OBESITY; HYPERTENSION; ESRD (end stage renal disease); HIP PAIN, LEFT; Osteoarthritis of both knees; BACK PAIN; DISORDER OF BONE AND CARTILAGE UNSPECIFIED; FATIGUE; ABNORMAL THYROID FUNCTION TESTS; LEG PAIN; Hyperparathyroidism, secondary renal; Normocytic anemia; Neck muscle spasm; Grief; Hypocalcemia; Hypomagnesemia; Prolonged Q-T  interval on ECG; Acute on chronic diastolic CHF (congestive heart failure); Anemia in chronic kidney disease; Left anterior knee pain; GERD (gastroesophageal reflux disease); OA (osteoarthritis) of knee; Knee pain; and Anemia in chronic kidney disease(285.21) on her problem list.    ALLERGIES:  is allergic to ace inhibitors; angiotensin receptor blockers; and other.  MEDICATIONS: has a current medication list which includes the following prescription(s): amlodipine, aspirin, calcium-vitamin d, folic acid, furosemide, hydralazine, hydrocodone-acetaminophen, labetalol, levothyroxine, lovastatin, omeprazole, paricalcitol, and sodium bicarbonate, and the following Facility-Administered Medications: epoetin alfa.  SURGICAL HISTORY:  Past Surgical History  Procedure Laterality Date  . Vaginal hysterectomy  1978  . Myomectomy  1978  . Right knee arthroscopy  1976  . Right knee replacement      REVIEW OF SYSTEMS:  Other than that discussed above is noncontributory.  PHYSICAL EXAMINATION: ECOG PERFORMANCE STATUS: 2 - Symptomatic, <50% confined to bed  Blood pressure 135/63, pulse 75, temperature 97.2 F (36.2 C), temperature source Oral, resp. rate 18, weight 220 lb 8 oz (100.018 kg).  GENERAL:alert, no distress and comfortable SKIN: skin color, texture, turgor are normal, no rashes or significant lesions EYES: normal, Conjunctiva pale and non-injected, sclera clear OROPHARYNX:no exudate, no erythema and lips, buccal mucosa, and tongue normal  NECK: supple, thyroid normal size, non-tender, without nodularity CHEST: Normal AP diameter. LYMPH:  no palpable lymphadenopathy in the cervical, axillary or inguinal LUNGS: clear to auscultation and percussion with normal breathing effort HEART: regular rate & rhythm with Grade 1-2/6 ejection systolic murmur at the lower left sternal border, nonradiating. ABDOMEN:abdomen soft, non-tende, obese,  with normal bowel sounds Musculoskeletal:no cyanosis of  digits and no  clubbing  NEURO: alert & oriented x 3 with fluent speech, no focal motor/sensory deficits. No asterixis.   LABORATORY DATA: 11/30/2012 WBC 9.6, hemoglobin 7.6, platelets 300,000 with a ferritin of 545, PTH of 276, BUN of 80, creatinine 8.8, phosphorus 8.1, CO2 17, potassium 4.5. No visits with results within 30 Day(s) from this visit. Latest known visit with results is:  Admission on 09/25/2012, Discharged on 09/28/2012  Component Date Value Range Status  . Troponin I 09/25/2012 <0.30  <0.30 ng/mL Final   Comment:                                 Due to the release kinetics of cTnI,                          a negative result within the first hours                          of the onset of symptoms does not rule out                          myocardial infarction with certainty.                          If myocardial infarction is still suspected,                          repeat the test at appropriate intervals.  . Pro B Natriuretic peptide (BNP) 09/25/2012 4794.0* 0 - 125 pg/mL Final  . Sodium 09/25/2012 137  135 - 145 mEq/L Final  . Potassium 09/25/2012 5.2* 3.5 - 5.1 mEq/L Final  . Chloride 09/25/2012 101  96 - 112 mEq/L Final  . CO2 09/25/2012 21  19 - 32 mEq/L Final  . Glucose, Bld 09/25/2012 107* 70 - 99 mg/dL Final  . BUN 40/98/1191 58* 6 - 23 mg/dL Final  . Creatinine, Ser 09/25/2012 6.72* 0.50 - 1.10 mg/dL Final  . Calcium 47/82/9562 5.3* 8.4 - 10.5 mg/dL Final   Comment: REPEATED TO VERIFY                          CRITICAL RESULT CALLED TO, READ BACK BY AND VERIFIED WITH:                          LEE,A AT 1446 ON 09/25/12 BY MOSLEYJ  . GFR calc non Af Amer 09/25/2012 5* >90 mL/min Final  . GFR calc Af Amer 09/25/2012 6* >90 mL/min Final   Comment:                                 The eGFR has been calculated                          using the CKD EPI equation.                          This calculation has not been  validated in all clinical                           situations.                          eGFR's persistently                          <90 mL/min signify                          possible Chronic Kidney Disease.  . WBC 09/25/2012 10.6* 4.0 - 10.5 K/uL Final  . RBC 09/25/2012 3.14* 3.87 - 5.11 MIL/uL Final  . Hemoglobin 09/25/2012 8.3* 12.0 - 15.0 g/dL Final  . HCT 16/12/9602 25.8* 36.0 - 46.0 % Final  . MCV 09/25/2012 82.2  78.0 - 100.0 fL Final  . MCH 09/25/2012 26.4  26.0 - 34.0 pg Final  . MCHC 09/25/2012 32.2  30.0 - 36.0 g/dL Final  . RDW 54/11/8117 16.0* 11.5 - 15.5 % Final  . Platelets 09/25/2012 320  150 - 400 K/uL Final  . Neutrophils Relative % 09/25/2012 80* 43 - 77 % Final  . Neutro Abs 09/25/2012 8.5* 1.7 - 7.7 K/uL Final  . Lymphocytes Relative 09/25/2012 11* 12 - 46 % Final  . Lymphs Abs 09/25/2012 1.2  0.7 - 4.0 K/uL Final  . Monocytes Relative 09/25/2012 8  3 - 12 % Final  . Monocytes Absolute 09/25/2012 0.8  0.1 - 1.0 K/uL Final  . Eosinophils Relative 09/25/2012 1  0 - 5 % Final  . Eosinophils Absolute 09/25/2012 0.1  0.0 - 0.7 K/uL Final  . Basophils Relative 09/25/2012 0  0 - 1 % Final  . Basophils Absolute 09/25/2012 0.0  0.0 - 0.1 K/uL Final  . Total Protein 09/25/2012 6.9  6.0 - 8.3 g/dL Final  . Albumin 14/78/2956 2.9* 3.5 - 5.2 g/dL Final  . AST 21/30/8657 24  0 - 37 U/L Final  . ALT 09/25/2012 17  0 - 35 U/L Final  . Alkaline Phosphatase 09/25/2012 92  39 - 117 U/L Final  . Total Bilirubin 09/25/2012 0.1* 0.3 - 1.2 mg/dL Final   REPEATED TO VERIFY  . Bilirubin, Direct 09/25/2012 <0.1  0.0 - 0.3 mg/dL Final   REPEATED TO VERIFY  . Indirect Bilirubin 09/25/2012 NOT CALCULATED  0.3 - 0.9 mg/dL Final  . Magnesium 84/69/6295 0.9* 1.5 - 2.5 mg/dL Final   Comment: REPEATED TO VERIFY                          CRITICAL RESULT CALLED TO, READ BACK BY AND VERIFIED WITH:                          YOUNG,J AT 3:50PM ON 09/25/12 BY MOSLEYJ  . Glucose-Capillary 09/25/2012 102* 70 - 99 mg/dL Final   . Comment 1 28/41/3244 Notify RN   Final  . Comment 2 09/25/2012 Documented in Chart   Final  . Sodium 09/26/2012 139  135 - 145 mEq/L Final  . Potassium 09/26/2012 5.1  3.5 - 5.1 mEq/L Final  . Chloride 09/26/2012 101  96 - 112 mEq/L Final  . CO2 09/26/2012 23  19 - 32 mEq/L Final  . Glucose, Bld 09/26/2012 87  70 - 99 mg/dL Final  .  BUN 09/26/2012 62* 6 - 23 mg/dL Final  . Creatinine, Ser 09/26/2012 7.15* 0.50 - 1.10 mg/dL Final  . Calcium 78/29/5621 6.0* 8.4 - 10.5 mg/dL Final   Comment: REPEATED TO VERIFY                          CRITICAL RESULT CALLED TO, READ BACK BY AND VERIFIED WITH:                          WATKINS,T AT 3086 ON 09/26/12 BY MOSLEYJ  . GFR calc non Af Amer 09/26/2012 5* >90 mL/min Final  . GFR calc Af Amer 09/26/2012 6* >90 mL/min Final   Comment:                                 The eGFR has been calculated                          using the CKD EPI equation.                          This calculation has not been                          validated in all clinical                          situations.                          eGFR's persistently                          <90 mL/min signify                          possible Chronic Kidney Disease.  . Glucose-Capillary 09/25/2012 133* 70 - 99 mg/dL Final  . Comment 1 57/84/6962 Notify RN   Final  . Comment 2 09/25/2012 Documented in Chart   Final  . Vitamin D 1, 25 (OH) Total 09/26/2012 <8* 18 - 72 pg/mL Final  . Vitamin D3 1, 25 (OH) 09/26/2012 <8   Final  . Vitamin D2 1, 25 (OH) 09/26/2012 <8   Final   Comment: (NOTE)                          Vitamin D3, 1,25(OH)2 indicates both endogenous                          production and supplementation.  Vitamin D2, 1,25(OH)2                          is an indicator of exogeous sources, such as diet or                          supplementation.  Interpretation and therapy are based                          on measurement of Vitamin D,1,25(OH)2, Total.  This test was developed and its performance                          characteristics have been determined by [employer], [place], [place].                          Performance characteristics refer to the                          analytical performance of the test.  . Sodium 09/27/2012 143  135 - 145 mEq/L Final  . Potassium 09/27/2012 4.7  3.5 - 5.1 mEq/L Final  . Chloride 09/27/2012 103  96 - 112 mEq/L Final  . CO2 09/27/2012 23  19 - 32 mEq/L Final  . Glucose, Bld 09/27/2012 105* 70 - 99 mg/dL Final  . BUN 16/12/9602 62* 6 - 23 mg/dL Final  . Creatinine, Ser 09/27/2012 7.18* 0.50 - 1.10 mg/dL Final  . Calcium 54/11/8117 6.7* 8.4 - 10.5 mg/dL Final  . GFR calc non Af Amer 09/27/2012 5* >90 mL/min Final  . GFR calc Af Amer 09/27/2012 6* >90 mL/min Final   Comment:                                 The eGFR has been calculated                          using the CKD EPI equation.                          This calculation has not been                          validated in all clinical                          situations.                          eGFR's persistently                          <90 mL/min signify                          possible Chronic Kidney Disease.  . Magnesium 09/27/2012 1.6  1.5 - 2.5 mg/dL Final  . Lipase 14/78/2956 23  11 - 59 U/L Final  . Total Protein 09/27/2012 6.8  6.0 - 8.3 g/dL Final  . Albumin 21/30/8657 2.8* 3.5 - 5.2 g/dL Final  . AST 84/69/6295 16  0 - 37 U/L Final  . ALT 09/27/2012 12  0 - 35 U/L Final  . Alkaline Phosphatase 09/27/2012 92  39 - 117 U/L Final  . Total Bilirubin 09/27/2012 0.2* 0.3 - 1.2 mg/dL Final  . Bilirubin, Direct 09/27/2012 <0.1  0.0 - 0.3 mg/dL Final  . Indirect Bilirubin 09/27/2012 NOT CALCULATED  0.3 - 0.9 mg/dL Final  . Color, Urine 28/41/3244 YELLOW  YELLOW Final  .  APPearance 09/27/2012 CLEAR  CLEAR Final  . Specific Gravity, Urine 09/27/2012 1.020  1.005 - 1.030 Final   . pH 09/27/2012 7.0  5.0 - 8.0 Final  . Glucose, UA 09/27/2012 NEGATIVE  NEGATIVE mg/dL Final  . Hgb urine dipstick 09/27/2012 SMALL* NEGATIVE Final  . Bilirubin Urine 09/27/2012 NEGATIVE  NEGATIVE Final  . Ketones, ur 09/27/2012 NEGATIVE  NEGATIVE mg/dL Final  . Protein, ur 16/12/9602 100* NEGATIVE mg/dL Final  . Urobilinogen, UA 09/27/2012 0.2  0.0 - 1.0 mg/dL Final  . Nitrite 54/11/8117 NEGATIVE  NEGATIVE Final  . Leukocytes, UA 09/27/2012 NEGATIVE  NEGATIVE Final  . Squamous Epithelial / LPF 09/27/2012 FEW* RARE Final  . WBC, UA 09/27/2012 0-2  <3 WBC/hpf Final  . RBC / HPF 09/27/2012 0-2  <3 RBC/hpf Final  . Bacteria, UA 09/27/2012 RARE  RARE Final  . Sodium 09/28/2012 140  135 - 145 mEq/L Final  . Potassium 09/28/2012 4.6  3.5 - 5.1 mEq/L Final  . Chloride 09/28/2012 100  96 - 112 mEq/L Final  . CO2 09/28/2012 25  19 - 32 mEq/L Final  . Glucose, Bld 09/28/2012 114* 70 - 99 mg/dL Final  . BUN 14/78/2956 63* 6 - 23 mg/dL Final  . Creatinine, Ser 09/28/2012 7.23* 0.50 - 1.10 mg/dL Final  . Calcium 21/30/8657 7.3* 8.4 - 10.5 mg/dL Final  . GFR calc non Af Amer 09/28/2012 5* >90 mL/min Final  . GFR calc Af Amer 09/28/2012 6* >90 mL/min Final   Comment:                                 The eGFR has been calculated                          using the CKD EPI equation.                          This calculation has not been                          validated in all clinical                          situations.                          eGFR's persistently                          <90 mL/min signify                          possible Chronic Kidney Disease.  . WBC 09/28/2012 9.9  4.0 - 10.5 K/uL Final  . RBC 09/28/2012 3.33* 3.87 - 5.11 MIL/uL Final  . Hemoglobin 09/28/2012 8.7* 12.0 - 15.0 g/dL Final  . HCT 84/69/6295 27.1* 36.0 - 46.0 % Final  . MCV 09/28/2012 81.4  78.0 - 100.0 fL Final  . MCH 09/28/2012 26.1  26.0 - 34.0 pg Final  . MCHC 09/28/2012 32.1  30.0 - 36.0 g/dL Final  .  RDW 28/41/3244 15.6* 11.5 - 15.5 % Final  . Platelets 09/28/2012 298  150 - 400 K/uL Final     Urinalysis    Component Value Date/Time  COLORURINE YELLOW 09/27/2012 1238   APPEARANCEUR CLEAR 09/27/2012 1238   LABSPEC 1.020 09/27/2012 1238   PHURINE 7.0 09/27/2012 1238   GLUCOSEU NEGATIVE 09/27/2012 1238   HGBUR SMALL* 09/27/2012 1238   BILIRUBINUR NEGATIVE 09/27/2012 1238   KETONESUR NEGATIVE 09/27/2012 1238   PROTEINUR 100* 09/27/2012 1238   UROBILINOGEN 0.2 09/27/2012 1238   NITRITE NEGATIVE 09/27/2012 1238   LEUKOCYTESUR NEGATIVE 09/27/2012 1238    RADIOGRAPHIC STUDIES: No results found.  ASSESSMENT: #1. Anemia secondary to chronic renal failure. #2. End stage renal disease, refusing dialysis.   #3. Diabetes mellitus, type I, controlled. #4. Obstructive sleep apnea syndrome, on CPAP. #5. Diastolic cardiac dysfunction.   PLAN: #1 Procrit 40,000 units subcutaneously today and weekly x4 weeks. #2. Soluble transferrin receptor will be measured to ensure that elevated ferritin is not acting as an acute phase reactant. #3. Followup with examination in 4 weeks. If iron stores are low as measured by STR, intravenous iron will also be administered.   All questions were answered. The patient knows to call the clinic with any problems, questions or concerns. We can certainly see the patient much sooner if necessary.  The patient and plan discussed with Alla German A and he is in agreement with the aforementioned.  I spent 25 minutes counseling the patient face to face. The total time spent in the appointment was 30 minutes.    Maurilio Lovely, MD 12/10/2012 3:03 PM

## 2012-12-10 NOTE — Patient Instructions (Addendum)
[facility] Discharge Instructions  RECOMMENDATIONS MADE BY THE CONSULTANT AND ANY TEST RESULTS WILL BE SENT TO YOUR REFERRING PHYSICIAN.  EXAM FINDINGS BY THE PHYSICIAN TODAY AND SIGNS OR SYMPTOMS TO REPORT TO CLINIC OR PRIMARY PHYSICIAN: Exam and findings as discussed by Dr. Zigmund Daniel.  Will draw some additional blood work today and restart your procrit.    MEDICATIONS PRESCRIBED:  none  INSTRUCTIONS/FOLLOW-UP: Will do procrit weekly for 4 weeks and re-evaluate.  Thank you for choosing [facility] to provide your oncology and hematology care.  To afford each patient quality time with our providers, please arrive at least 15 minutes before your scheduled appointment time.  With your help, our goal is to use those 15 minutes to complete the necessary work-up to ensure our physicians have the information they need to help with your evaluation and healthcare recommendations.    Effective January 1st, 2014, we ask that you re-schedule your appointment with our physicians should you arrive 10 or more minutes late for your appointment.  We strive to give you quality time with our providers, and arriving late affects you and other patients whose appointments are after yours.    Again, thank you for choosing [facility].  Our hope is that these requests will decrease the amount of time that you wait before being seen by our physicians.       _____________________________________________________________  Should you have questions after your visit to [facility], please contact our office at [phone] between the hours of 8:30 a.m. and 5:00 p.m.  Voicemails left after 4:30 p.m. will not be returned until the following business day.  For prescription refill requests, have your pharmacy contact our office with your prescription refill request.

## 2012-12-13 ENCOUNTER — Other Ambulatory Visit (HOSPITAL_COMMUNITY): Payer: Self-pay | Admitting: Oncology

## 2012-12-17 ENCOUNTER — Encounter (HOSPITAL_BASED_OUTPATIENT_CLINIC_OR_DEPARTMENT_OTHER): Payer: Medicare Other

## 2012-12-17 ENCOUNTER — Ambulatory Visit (HOSPITAL_COMMUNITY): Payer: Medicare Other

## 2012-12-17 VITALS — BP 165/63 | HR 80

## 2012-12-17 DIAGNOSIS — N186 End stage renal disease: Secondary | ICD-10-CM

## 2012-12-17 DIAGNOSIS — D631 Anemia in chronic kidney disease: Secondary | ICD-10-CM

## 2012-12-17 DIAGNOSIS — R7302 Impaired glucose tolerance (oral): Secondary | ICD-10-CM

## 2012-12-17 DIAGNOSIS — D649 Anemia, unspecified: Secondary | ICD-10-CM

## 2012-12-17 LAB — CBC
HCT: 23 % — ABNORMAL LOW (ref 36.0–46.0)
Hemoglobin: 7.4 g/dL — ABNORMAL LOW (ref 12.0–15.0)
MCH: 25.4 pg — ABNORMAL LOW (ref 26.0–34.0)
MCHC: 32.2 g/dL (ref 30.0–36.0)
RDW: 17 % — ABNORMAL HIGH (ref 11.5–15.5)

## 2012-12-17 MED ORDER — EPOETIN ALFA 40000 UNIT/ML IJ SOLN
40000.0000 [IU] | Freq: Once | INTRAMUSCULAR | Status: AC
  Administered 2012-12-17: 40000 [IU] via SUBCUTANEOUS

## 2012-12-17 MED ORDER — EPOETIN ALFA 40000 UNIT/ML IJ SOLN
INTRAMUSCULAR | Status: AC
  Filled 2012-12-17: qty 1

## 2012-12-17 NOTE — Progress Notes (Signed)
Procrit 40,000 units given sub-q to lower right abd.  Tolerated well.

## 2012-12-23 ENCOUNTER — Other Ambulatory Visit: Payer: Self-pay

## 2012-12-23 DIAGNOSIS — K219 Gastro-esophageal reflux disease without esophagitis: Secondary | ICD-10-CM

## 2012-12-23 MED ORDER — LOVASTATIN 40 MG PO TABS: 80.0000 mg | ORAL_TABLET | Freq: Every day | ORAL | Status: DC

## 2012-12-23 MED ORDER — LEVOTHYROXINE SODIUM 25 MCG PO TABS: 25.0000 ug | ORAL_TABLET | Freq: Every day | ORAL | Status: DC

## 2012-12-23 MED ORDER — AMLODIPINE BESYLATE 10 MG PO TABS: 10.0000 mg | ORAL_TABLET | Freq: Every day | ORAL | Status: DC

## 2012-12-23 MED ORDER — LABETALOL HCL 200 MG PO TABS: 200.0000 mg | ORAL_TABLET | Freq: Two times a day (BID) | ORAL | Status: DC

## 2012-12-23 MED ORDER — OMEPRAZOLE 20 MG PO CPDR: 20.0000 mg | DELAYED_RELEASE_CAPSULE | Freq: Every day | ORAL | Status: DC

## 2012-12-24 ENCOUNTER — Ambulatory Visit (HOSPITAL_COMMUNITY): Payer: Medicare Other

## 2012-12-24 ENCOUNTER — Encounter (HOSPITAL_COMMUNITY): Payer: Medicare Other | Attending: Hematology and Oncology

## 2012-12-24 VITALS — BP 153/68 | HR 79 | Resp 18

## 2012-12-24 DIAGNOSIS — D649 Anemia, unspecified: Secondary | ICD-10-CM

## 2012-12-24 DIAGNOSIS — D631 Anemia in chronic kidney disease: Secondary | ICD-10-CM | POA: Insufficient documentation

## 2012-12-24 DIAGNOSIS — N186 End stage renal disease: Secondary | ICD-10-CM

## 2012-12-24 DIAGNOSIS — N189 Chronic kidney disease, unspecified: Secondary | ICD-10-CM | POA: Insufficient documentation

## 2012-12-24 LAB — CBC
HCT: 26 % — ABNORMAL LOW (ref 36.0–46.0)
MCHC: 31.9 g/dL (ref 30.0–36.0)
Platelets: 369 10*3/uL (ref 150–400)
RDW: 19.5 % — ABNORMAL HIGH (ref 11.5–15.5)

## 2012-12-24 MED ORDER — EPOETIN ALFA 40000 UNIT/ML IJ SOLN
40000.0000 [IU] | Freq: Once | INTRAMUSCULAR | Status: AC
  Administered 2012-12-24: 40000 [IU] via SUBCUTANEOUS

## 2012-12-24 MED ORDER — EPOETIN ALFA 40000 UNIT/ML IJ SOLN
INTRAMUSCULAR | Status: AC
  Filled 2012-12-24: qty 1

## 2012-12-24 NOTE — Progress Notes (Signed)
Terri Kelly presented for labwork. Labs per MD order drawn via Peripheral Line 23 gauge needle inserted in right antecubital.  Good blood return present. Procedure without incident.  Needle removed intact. Patient tolerated procedure well.  Terri Kelly presents today for injection per MD orders. Procrit 40,000 units administered SQ in right Abdomen. Administration without incident. Patient tolerated well.

## 2012-12-31 ENCOUNTER — Encounter (HOSPITAL_BASED_OUTPATIENT_CLINIC_OR_DEPARTMENT_OTHER): Payer: Medicare Other

## 2012-12-31 ENCOUNTER — Ambulatory Visit (HOSPITAL_COMMUNITY): Payer: Medicare Other

## 2012-12-31 DIAGNOSIS — D649 Anemia, unspecified: Secondary | ICD-10-CM

## 2012-12-31 DIAGNOSIS — N186 End stage renal disease: Secondary | ICD-10-CM

## 2012-12-31 DIAGNOSIS — D631 Anemia in chronic kidney disease: Secondary | ICD-10-CM

## 2012-12-31 LAB — CBC
HCT: 27.1 % — ABNORMAL LOW (ref 36.0–46.0)
MCH: 25.7 pg — ABNORMAL LOW (ref 26.0–34.0)
Platelets: 398 10*3/uL (ref 150–400)
RBC: 3.31 MIL/uL — ABNORMAL LOW (ref 3.87–5.11)
WBC: 8.3 10*3/uL (ref 4.0–10.5)

## 2012-12-31 MED ORDER — EPOETIN ALFA 40000 UNIT/ML IJ SOLN
40000.0000 [IU] | Freq: Once | INTRAMUSCULAR | Status: AC
  Administered 2012-12-31: 40000 [IU] via SUBCUTANEOUS
  Filled 2012-12-31: qty 1

## 2012-12-31 NOTE — Progress Notes (Signed)
Terri Kelly presented for labwork. Labs per MD order drawn via Peripheral Line 25 gauge needle inserted in rt arm.  Good blood return present. Procedure without incident.  Needle removed intact. Patient tolerated procedure well.  Terri Kelly presents today for injection per MD orders. Procrit [address] units administered SQ in left Abdomen. Administration without incident. Patient tolerated well.

## 2013-01-07 ENCOUNTER — Encounter (HOSPITAL_COMMUNITY): Payer: Self-pay

## 2013-01-07 ENCOUNTER — Encounter (HOSPITAL_BASED_OUTPATIENT_CLINIC_OR_DEPARTMENT_OTHER): Payer: Medicare Other

## 2013-01-07 ENCOUNTER — Other Ambulatory Visit (HOSPITAL_COMMUNITY): Payer: Medicare Other

## 2013-01-07 ENCOUNTER — Ambulatory Visit (HOSPITAL_COMMUNITY): Payer: Medicare Other

## 2013-01-07 VITALS — BP 168/67 | HR 73 | Temp 98.1°F | Resp 16 | Wt 214.3 lb

## 2013-01-07 DIAGNOSIS — N189 Chronic kidney disease, unspecified: Secondary | ICD-10-CM

## 2013-01-07 DIAGNOSIS — D631 Anemia in chronic kidney disease: Secondary | ICD-10-CM

## 2013-01-07 DIAGNOSIS — N186 End stage renal disease: Secondary | ICD-10-CM

## 2013-01-07 DIAGNOSIS — D649 Anemia, unspecified: Secondary | ICD-10-CM

## 2013-01-07 LAB — CBC
HCT: 31.3 % — ABNORMAL LOW (ref 36.0–46.0)
MCH: 25.9 pg — ABNORMAL LOW (ref 26.0–34.0)
MCHC: 31.6 g/dL (ref 30.0–36.0)
MCV: 81.9 fL (ref 78.0–100.0)
Platelets: 376 10*3/uL (ref 150–400)
RDW: 20.3 % — ABNORMAL HIGH (ref 11.5–15.5)
WBC: 9.7 10*3/uL (ref 4.0–10.5)

## 2013-01-07 MED ORDER — DIPHENHYDRAMINE HCL 50 MG PO TABS: 25.0000 mg | ORAL_TABLET | Freq: Every evening | ORAL | Status: DC | PRN

## 2013-01-07 MED ORDER — EPOETIN ALFA 40000 UNIT/ML IJ SOLN
40000.0000 [IU] | Freq: Once | INTRAMUSCULAR | Status: AC
  Administered 2013-01-07: 40000 [IU] via SUBCUTANEOUS
  Filled 2013-01-07: qty 1

## 2013-01-07 NOTE — Progress Notes (Signed)
[facility] OFFICE PROGRESS NOTE  Terri Overman, MD [address], [address] [place] [address]  DIAGNOSIS: Anemia in chronic kidney disease - Plan: Ferritin  Normocytic anemia - Plan: CBC, PROCRIT TREATMENT CONDITION, epoetin alfa (EPOGEN,PROCRIT) injection 40,000 Units, PROCRIT TREATMENT CONDITION  ESRD (end stage renal disease) - Plan: CBC, PROCRIT TREATMENT CONDITION, epoetin alfa (EPOGEN,PROCRIT) injection 40,000 Units, PROCRIT TREATMENT CONDITION  Chief Complaint  Patient presents with  . Anemia    chronic kidney disease    CURRENT THERAPY: Procrit 40,000 units subcutaneously weekly  INTERVAL HISTORY: Terri Kelly 77 y.o. female returns for followup of anemia of chronic disease in association with end-stage renal disease, refusing dialysis. She denies any nausea, vomiting, diarrhea, constipation, chest pain, PND, orthopnea, palpitations, worsening lower 70 swelling or redness, dysuria, hesitancy, fever, night sweats, cough, shortness of breath, but is having trouble falling asleep and staying asleep.   MEDICAL HISTORY: Past Medical History  Diagnosis Date  . Hyperlipidemia   . Obesity   . Hypertension   . Diabetes mellitus type 1   . Back pain   . Sleep apnea 2009    non compliant with c-pap  . Diastolic dysfunction Nov 2011    Hospital[facility] for 3 days at [facility]   . Normocytic anemia 04/09/2012  . Allergy   . Arthritis   . Chronic kidney disease     does not want HD    INTERIM HISTORY: has IGT (impaired glucose tolerance); HYPERLIPIDEMIA; OBESITY; HYPERTENSION; ESRD (end stage renal disease); HIP PAIN, LEFT; Osteoarthritis of both knees; BACK PAIN; DISORDER OF BONE AND CARTILAGE UNSPECIFIED; FATIGUE; ABNORMAL THYROID FUNCTION TESTS; LEG PAIN; Hyperparathyroidism, secondary renal; Normocytic anemia; Neck muscle spasm; Grief; Hypocalcemia; Hypomagnesemia; Prolonged Q-T interval on ECG; Acute on chronic diastolic CHF (congestive heart failure);  Anemia in chronic kidney disease; Left anterior knee pain; GERD (gastroesophageal reflux disease); OA (osteoarthritis) of knee; Knee pain; and Anemia in chronic kidney disease(285.21) on her problem list.    ALLERGIES:  is allergic to ace inhibitors; angiotensin receptor blockers; and other.  MEDICATIONS: has a current medication list which includes the following prescription(s): amlodipine, aspirin, calcium-vitamin d, folic acid, furosemide, hydralazine, hydrocodone-acetaminophen, labetalol, levothyroxine, lovastatin, omeprazole, paricalcitol, sodium bicarbonate, thiamine hcl, and diphenhydramine.  SURGICAL HISTORY:  Past Surgical History  Procedure Laterality Date  . Vaginal hysterectomy  1978  . Myomectomy  1978  . Right knee arthroscopy  1976  . Right knee replacement      FAMILY HISTORY: family history includes Heart disease in her father; Hypertension in her brother, brother, and sister; Kidney disease in her father; Kidney failure in her brother and sister.  SOCIAL HISTORY:  reports that she has quit smoking. She has never used smokeless tobacco. She reports that she does not drink alcohol or use illicit drugs.  REVIEW OF SYSTEMS:  Other than that discussed above is noncontributory.  PHYSICAL EXAMINATION: ECOG PERFORMANCE STATUS: 1 - Symptomatic but completely ambulatory  Blood pressure 168/67, pulse 73, temperature 98.1 F (36.7 C), temperature source Oral, resp. rate 16, weight 214 lb 4.8 oz (97.206 kg).  GENERAL:alert, no distress and comfortable SKIN: skin color, texture, turgor are normal, no rashes or significant lesions EYES: PERLA; Conjunctiva are pink and non-injected, sclera clear OROPHARYNX:no exudate, no erythema on lips, buccal mucosa, or tongue. NECK: supple, thyroid normal size, non-tender, without nodularity. No masses CHEST: No breast masses. LYMPH:  no palpable lymphadenopathy in the cervical, axillary or inguinal LUNGS: clear to auscultation and percussion  with  normal breathing effort HEART: regular rate & rhythm and no murmurs and no lower extremity edema ABDOMEN:abdomen soft, non-tender and normal bowel sounds MUSCULOSKELETAL:no cyanosis of digits and no clubbing. Range of motion normal.  NEURO: alert & oriented x 3 with fluent speech, no focal motor/sensory deficits   LABORATORY DATA: Office Visit on 01/07/2013  Component Date Value Range Status  . WBC 01/07/2013 9.7  4.0 - 10.5 K/uL Final  . RBC 01/07/2013 3.82* 3.87 - 5.11 MIL/uL Final  . Hemoglobin 01/07/2013 9.9* 12.0 - 15.0 g/dL Final  . HCT 40/98/1191 31.3* 36.0 - 46.0 % Final  . MCV 01/07/2013 81.9  78.0 - 100.0 fL Final  . MCH 01/07/2013 25.9* 26.0 - 34.0 pg Final  . MCHC 01/07/2013 31.6  30.0 - 36.0 g/dL Final  . RDW 47/82/9562 20.3* 11.5 - 15.5 % Final  . Platelets 01/07/2013 376  150 - 400 K/uL Final  Infusion on 12/31/2012  Component Date Value Range Status  . WBC 12/31/2012 8.3  4.0 - 10.5 K/uL Final  . RBC 12/31/2012 3.31* 3.87 - 5.11 MIL/uL Final  . Hemoglobin 12/31/2012 8.5* 12.0 - 15.0 g/dL Final  . HCT 13/10/6576 27.1* 36.0 - 46.0 % Final  . MCV 12/31/2012 81.9  78.0 - 100.0 fL Final  . MCH 12/31/2012 25.7* 26.0 - 34.0 pg Final  . MCHC 12/31/2012 31.4  30.0 - 36.0 g/dL Final  . RDW 46/96/2952 20.4* 11.5 - 15.5 % Final  . Platelets 12/31/2012 398  150 - 400 K/uL Final  Infusion on 12/24/2012  Component Date Value Range Status  . WBC 12/24/2012 9.2  4.0 - 10.5 K/uL Final  . RBC 12/24/2012 3.25* 3.87 - 5.11 MIL/uL Final  . Hemoglobin 12/24/2012 8.3* 12.0 - 15.0 g/dL Final  . HCT 84/13/2440 26.0* 36.0 - 46.0 % Final  . MCV 12/24/2012 80.0  78.0 - 100.0 fL Final  . MCH 12/24/2012 25.5* 26.0 - 34.0 pg Final  . MCHC 12/24/2012 31.9  30.0 - 36.0 g/dL Final  . RDW 01/18/2535 19.5* 11.5 - 15.5 % Final  . Platelets 12/24/2012 369  150 - 400 K/uL Final  Infusion on 12/17/2012  Component Date Value Range Status  . WBC 12/17/2012 9.7  4.0 - 10.5 K/uL Final  . RBC  12/17/2012 2.91* 3.87 - 5.11 MIL/uL Final  . Hemoglobin 12/17/2012 7.4* 12.0 - 15.0 g/dL Final  . HCT 64/40/3474 23.0* 36.0 - 46.0 % Final  . MCV 12/17/2012 79.0  78.0 - 100.0 fL Final  . MCH 12/17/2012 25.4* 26.0 - 34.0 pg Final  . MCHC 12/17/2012 32.2  30.0 - 36.0 g/dL Final  . RDW 25/95/6387 17.0* 11.5 - 15.5 % Final  . Platelets 12/17/2012 327  150 - 400 K/uL Final    PATHOLOGY:  Urinalysis    Component Value Date/Time   COLORURINE YELLOW 09/27/2012 1238   APPEARANCEUR CLEAR 09/27/2012 1238   LABSPEC 1.020 09/27/2012 1238   PHURINE 7.0 09/27/2012 1238   GLUCOSEU NEGATIVE 09/27/2012 1238   HGBUR SMALL* 09/27/2012 1238   BILIRUBINUR NEGATIVE 09/27/2012 1238   KETONESUR NEGATIVE 09/27/2012 1238   PROTEINUR 100* 09/27/2012 1238   UROBILINOGEN 0.2 09/27/2012 1238   NITRITE NEGATIVE 09/27/2012 1238   LEUKOCYTESUR NEGATIVE 09/27/2012 1238    RADIOGRAPHIC STUDIES: No results found.  ASSESSMENT: #1. Anemia of chronic disease in the setting of stage IV renal disease, refusing dialysis, stable hemoglobin. #2. End stage renal disease, stage IV with creatinine over 7.  #3. Obstructive sleep apnea syndrome, on CPAP. #4.  Diabetes mellitus, type II, controlled. #5. Diastolic cardiac dysfunction.   PLAN: #1. Procrit 40,000 units subcutaneous he weekly. #2. Followup in 6 weeks with examination.   All questions were answered. The patient knows to call the clinic with any problems, questions or concerns. We can certainly see the patient much sooner if necessary.   I spent 15 minutes counseling the patient face to face. The total time spent in the appointment was 20 minutes.    Maurilio Lovely, MD 01/07/2013 2:28 PM

## 2013-01-07 NOTE — Progress Notes (Signed)
Terri Kelly presents today for injection per MD orders. Procrit 40,000 administered SQ in left Abdomen. Administration without incident. Patient tolerated well.  

## 2013-01-07 NOTE — Patient Instructions (Signed)
[facility] Discharge Instructions  RECOMMENDATIONS MADE BY THE CONSULTANT AND ANY TEST RESULTS WILL BE SENT TO YOUR REFERRING PHYSICIAN.  EXAM FINDINGS BY THE PHYSICIAN TODAY AND SIGNS OR SYMPTOMS TO REPORT TO CLINIC OR PRIMARY PHYSICIAN: Exam and findings as discussed by Dr. Zigmund Daniel.  No changes at present.  Will continue weekly labs and procrit.  MEDICATIONS PRESCRIBED:  none  INSTRUCTIONS/FOLLOW-UP: Weekly as scheduled.  Thank you for choosing [facility] to provide your oncology and hematology care.  To afford each patient quality time with our providers, please arrive at least 15 minutes before your scheduled appointment time.  With your help, our goal is to use those 15 minutes to complete the necessary work-up to ensure our physicians have the information they need to help with your evaluation and healthcare recommendations.    Effective January 1st, 2014, we ask that you re-schedule your appointment with our physicians should you arrive 10 or more minutes late for your appointment.  We strive to give you quality time with our providers, and arriving late affects you and other patients whose appointments are after yours.    Again, thank you for choosing [facility].  Our hope is that these requests will decrease the amount of time that you wait before being seen by our physicians.       _____________________________________________________________  Should you have questions after your visit to [facility], please contact our office at [phone] between the hours of 8:30 a.m. and 5:00 p.m.  Voicemails left after 4:30 p.m. will not be returned until the following business day.  For prescription refill requests, have your pharmacy contact our office with your prescription refill request.

## 2013-01-08 LAB — MISCELLANEOUS TEST

## 2013-01-11 ENCOUNTER — Telehealth (HOSPITAL_COMMUNITY): Payer: Self-pay | Admitting: *Deleted

## 2013-01-11 ENCOUNTER — Other Ambulatory Visit (HOSPITAL_COMMUNITY): Payer: Self-pay | Admitting: Hematology and Oncology

## 2013-01-11 MED ORDER — DIPHENHYDRAMINE HCL 25 MG PO TABS: 25.0000 mg | ORAL_TABLET | Freq: Every evening | ORAL | Status: DC | PRN

## 2013-01-12 ENCOUNTER — Other Ambulatory Visit: Payer: Self-pay | Admitting: Family Medicine

## 2013-01-13 ENCOUNTER — Other Ambulatory Visit: Payer: Self-pay

## 2013-01-13 MED ORDER — HYDROCODONE-ACETAMINOPHEN 7.5-325 MG PO TABS: ORAL_TABLET | ORAL | Status: DC

## 2013-01-14 ENCOUNTER — Encounter (HOSPITAL_BASED_OUTPATIENT_CLINIC_OR_DEPARTMENT_OTHER): Payer: Medicare Other

## 2013-01-14 VITALS — BP 156/70 | HR 74

## 2013-01-14 DIAGNOSIS — D631 Anemia in chronic kidney disease: Secondary | ICD-10-CM

## 2013-01-14 DIAGNOSIS — N186 End stage renal disease: Secondary | ICD-10-CM

## 2013-01-14 DIAGNOSIS — D649 Anemia, unspecified: Secondary | ICD-10-CM

## 2013-01-14 LAB — FERRITIN: Ferritin: 138 ng/mL (ref 10–291)

## 2013-01-14 LAB — CBC
Platelets: 364 10*3/uL (ref 150–400)
RBC: 3.89 MIL/uL (ref 3.87–5.11)
WBC: 10.7 10*3/uL — ABNORMAL HIGH (ref 4.0–10.5)

## 2013-01-14 MED ORDER — EPOETIN ALFA 40000 UNIT/ML IJ SOLN
INTRAMUSCULAR | Status: AC
  Filled 2013-01-14: qty 1

## 2013-01-14 MED ORDER — EPOETIN ALFA 40000 UNIT/ML IJ SOLN
40000.0000 [IU] | Freq: Once | INTRAMUSCULAR | Status: AC
  Administered 2013-01-14: 40000 [IU] via SUBCUTANEOUS

## 2013-01-14 NOTE — Progress Notes (Signed)
Procrit 40,000 units given sub-q to lower right abd.  Tolerated well.

## 2013-01-19 ENCOUNTER — Telehealth: Payer: Self-pay

## 2013-01-19 DIAGNOSIS — I10 Essential (primary) hypertension: Secondary | ICD-10-CM

## 2013-01-19 NOTE — Telephone Encounter (Signed)
Lab ordered and faxed.

## 2013-01-20 ENCOUNTER — Encounter (HOSPITAL_BASED_OUTPATIENT_CLINIC_OR_DEPARTMENT_OTHER): Payer: Medicare Other

## 2013-01-20 VITALS — BP 181/78 | HR 67

## 2013-01-20 DIAGNOSIS — D649 Anemia, unspecified: Secondary | ICD-10-CM

## 2013-01-20 DIAGNOSIS — D631 Anemia in chronic kidney disease: Secondary | ICD-10-CM

## 2013-01-20 DIAGNOSIS — N186 End stage renal disease: Secondary | ICD-10-CM

## 2013-01-20 LAB — CBC
Hemoglobin: 10.3 g/dL — ABNORMAL LOW (ref 12.0–15.0)
Platelets: 344 10*3/uL (ref 150–400)
RBC: 3.94 MIL/uL (ref 3.87–5.11)
WBC: 11.6 10*3/uL — ABNORMAL HIGH (ref 4.0–10.5)

## 2013-01-20 MED ORDER — EPOETIN ALFA 40000 UNIT/ML IJ SOLN
40000.0000 [IU] | Freq: Once | INTRAMUSCULAR | Status: AC
  Administered 2013-01-20: 40000 [IU] via SUBCUTANEOUS

## 2013-01-20 MED ORDER — EPOETIN ALFA 40000 UNIT/ML IJ SOLN
INTRAMUSCULAR | Status: AC
  Filled 2013-01-20: qty 1

## 2013-01-20 NOTE — Progress Notes (Signed)
Terri Kelly presents today for injection per MD orders. Procrit 40000 units administered SQ in right Abdomen. Administration without incident. Patient tolerated well.  

## 2013-01-28 ENCOUNTER — Encounter (HOSPITAL_COMMUNITY): Payer: Medicare Other | Attending: Hematology and Oncology

## 2013-01-28 VITALS — BP 158/64 | HR 70

## 2013-01-28 DIAGNOSIS — D649 Anemia, unspecified: Secondary | ICD-10-CM | POA: Insufficient documentation

## 2013-01-28 DIAGNOSIS — R7302 Impaired glucose tolerance (oral): Secondary | ICD-10-CM

## 2013-01-28 DIAGNOSIS — R7309 Other abnormal glucose: Secondary | ICD-10-CM | POA: Insufficient documentation

## 2013-01-28 DIAGNOSIS — N186 End stage renal disease: Secondary | ICD-10-CM | POA: Insufficient documentation

## 2013-01-28 LAB — CBC
HCT: 35.1 % — ABNORMAL LOW (ref 36.0–46.0)
Hemoglobin: 10.9 g/dL — ABNORMAL LOW (ref 12.0–15.0)
MCHC: 31.1 g/dL (ref 30.0–36.0)
MCV: 82.4 fL (ref 78.0–100.0)
RBC: 4.26 MIL/uL (ref 3.87–5.11)
RDW: 19 % — ABNORMAL HIGH (ref 11.5–15.5)
WBC: 9 10*3/uL (ref 4.0–10.5)

## 2013-01-28 MED ORDER — EPOETIN ALFA 40000 UNIT/ML IJ SOLN
INTRAMUSCULAR | Status: AC
  Filled 2013-01-28: qty 1

## 2013-01-28 MED ORDER — EPOETIN ALFA 40000 UNIT/ML IJ SOLN
40000.0000 [IU] | Freq: Once | INTRAMUSCULAR | Status: AC
  Administered 2013-01-28: 40000 [IU] via SUBCUTANEOUS

## 2013-01-28 NOTE — Progress Notes (Signed)
Terri Kelly presents today for injection per MD orders. Procrit 40,000units administered SQ in right Abdomen. Administration without incident. Patient tolerated well.  

## 2013-02-04 ENCOUNTER — Encounter (HOSPITAL_COMMUNITY): Payer: Medicare Other

## 2013-02-04 DIAGNOSIS — D649 Anemia, unspecified: Secondary | ICD-10-CM

## 2013-02-04 LAB — CBC WITH DIFFERENTIAL/PLATELET
Basophils Relative: 1 % (ref 0–1)
Eosinophils Relative: 3 % (ref 0–5)
HCT: 35.8 % — ABNORMAL LOW (ref 36.0–46.0)
Hemoglobin: 11.1 g/dL — ABNORMAL LOW (ref 12.0–15.0)
Lymphs Abs: 1.7 10*3/uL (ref 0.7–4.0)
MCH: 25.8 pg — ABNORMAL LOW (ref 26.0–34.0)
MCHC: 31 g/dL (ref 30.0–36.0)
Monocytes Absolute: 0.5 10*3/uL (ref 0.1–1.0)
Monocytes Relative: 6 % (ref 3–12)
Neutro Abs: 6.2 10*3/uL (ref 1.7–7.7)
Neutrophils Relative %: 71 % (ref 43–77)
RBC: 4.3 MIL/uL (ref 3.87–5.11)

## 2013-02-04 NOTE — Progress Notes (Signed)
Lab draw completed by B. Antony Salmon, RN.  Lab parameters not met, ESA not given.  Lab Results  Component Value Date   HGB 11.1* 02/04/2013

## 2013-02-11 ENCOUNTER — Encounter (HOSPITAL_BASED_OUTPATIENT_CLINIC_OR_DEPARTMENT_OTHER): Payer: Medicare Other

## 2013-02-11 ENCOUNTER — Other Ambulatory Visit: Payer: Self-pay

## 2013-02-11 DIAGNOSIS — N186 End stage renal disease: Secondary | ICD-10-CM

## 2013-02-11 DIAGNOSIS — D649 Anemia, unspecified: Secondary | ICD-10-CM

## 2013-02-11 LAB — CBC
Hemoglobin: 11.8 g/dL — ABNORMAL LOW (ref 12.0–15.0)
MCH: 25.9 pg — ABNORMAL LOW (ref 26.0–34.0)
MCHC: 31.6 g/dL (ref 30.0–36.0)
MCV: 82.2 fL (ref 78.0–100.0)
Platelets: 296 10*3/uL (ref 150–400)
RBC: 4.55 MIL/uL (ref 3.87–5.11)
RDW: 16.6 % — ABNORMAL HIGH (ref 11.5–15.5)

## 2013-02-11 MED ORDER — HYDROCODONE-ACETAMINOPHEN 7.5-325 MG PO TABS: ORAL_TABLET | ORAL | Status: DC

## 2013-02-11 NOTE — Progress Notes (Signed)
Hgb 11.8.  Procrit inj held today. To see md and get inj. If needed on 02/16/2013.

## 2013-02-16 ENCOUNTER — Ambulatory Visit (HOSPITAL_COMMUNITY): Payer: Medicare Other

## 2013-02-16 ENCOUNTER — Encounter (HOSPITAL_COMMUNITY): Payer: Medicare Other

## 2013-02-25 ENCOUNTER — Encounter (HOSPITAL_COMMUNITY): Payer: Medicare Other | Attending: Hematology and Oncology

## 2013-02-25 VITALS — BP 182/77 | HR 61

## 2013-02-25 DIAGNOSIS — N186 End stage renal disease: Secondary | ICD-10-CM | POA: Insufficient documentation

## 2013-02-25 DIAGNOSIS — D649 Anemia, unspecified: Secondary | ICD-10-CM | POA: Insufficient documentation

## 2013-02-25 LAB — CBC
HCT: 34.5 % — ABNORMAL LOW (ref 36.0–46.0)
MCH: 25.8 pg — ABNORMAL LOW (ref 26.0–34.0)
MCHC: 31.9 g/dL (ref 30.0–36.0)
MCV: 80.8 fL (ref 78.0–100.0)
Platelets: 241 10*3/uL (ref 150–400)
RDW: 15.9 % — ABNORMAL HIGH (ref 11.5–15.5)

## 2013-02-25 MED ORDER — EPOETIN ALFA 40000 UNIT/ML IJ SOLN
INTRAMUSCULAR | Status: AC
  Filled 2013-02-25: qty 1

## 2013-02-25 MED ORDER — EPOETIN ALFA 40000 UNIT/ML IJ SOLN
40000.0000 [IU] | Freq: Once | INTRAMUSCULAR | Status: AC
  Administered 2013-02-25: 40000 [IU] via SUBCUTANEOUS

## 2013-02-25 NOTE — Progress Notes (Signed)
Terri Kelly presents today for injection per MD orders. Procrit 40,000units administered SQ in right Abdomen. Administration without incident. Patient tolerated well.  

## 2013-03-02 ENCOUNTER — Encounter: Payer: Self-pay | Admitting: Family Medicine

## 2013-03-02 ENCOUNTER — Ambulatory Visit (INDEPENDENT_AMBULATORY_CARE_PROVIDER_SITE_OTHER): Payer: Medicare Other | Admitting: Family Medicine

## 2013-03-02 ENCOUNTER — Encounter (INDEPENDENT_AMBULATORY_CARE_PROVIDER_SITE_OTHER): Payer: Self-pay

## 2013-03-02 VITALS — BP 160/82 | HR 76 | Resp 18 | Ht 60.0 in | Wt 211.0 lb

## 2013-03-02 DIAGNOSIS — G4700 Insomnia, unspecified: Secondary | ICD-10-CM

## 2013-03-02 DIAGNOSIS — R7302 Impaired glucose tolerance (oral): Secondary | ICD-10-CM

## 2013-03-02 DIAGNOSIS — N186 End stage renal disease: Secondary | ICD-10-CM

## 2013-03-02 DIAGNOSIS — F4321 Adjustment disorder with depressed mood: Secondary | ICD-10-CM

## 2013-03-02 DIAGNOSIS — E785 Hyperlipidemia, unspecified: Secondary | ICD-10-CM

## 2013-03-02 DIAGNOSIS — I509 Heart failure, unspecified: Secondary | ICD-10-CM

## 2013-03-02 DIAGNOSIS — R7309 Other abnormal glucose: Secondary | ICD-10-CM

## 2013-03-02 DIAGNOSIS — M170 Bilateral primary osteoarthritis of knee: Secondary | ICD-10-CM

## 2013-03-02 DIAGNOSIS — E669 Obesity, unspecified: Secondary | ICD-10-CM

## 2013-03-02 DIAGNOSIS — M1710 Unilateral primary osteoarthritis, unspecified knee: Secondary | ICD-10-CM

## 2013-03-02 DIAGNOSIS — I5033 Acute on chronic diastolic (congestive) heart failure: Secondary | ICD-10-CM

## 2013-03-02 DIAGNOSIS — I10 Essential (primary) hypertension: Secondary | ICD-10-CM

## 2013-03-02 DIAGNOSIS — R946 Abnormal results of thyroid function studies: Secondary | ICD-10-CM

## 2013-03-02 MED ORDER — TEMAZEPAM 15 MG PO CAPS: 15.0000 mg | ORAL_CAPSULE | Freq: Every evening | ORAL | Status: DC | PRN

## 2013-03-02 NOTE — Patient Instructions (Signed)
F/u in 4 month, call if you need me before  New for insomnia, is restoril 15 mg one at night, the week of December 29 if not effective then increase to TWO tablets and see if better, l;et me know by the Thursday of that week  Fasting lipid, cmp, hBA1c, TSH, hBA1c, first week in January at the cancer center   All the best for 2015

## 2013-03-02 NOTE — Progress Notes (Signed)
   Subjective:    Patient ID: Terri Kelly, female    DOB: Sep 13, 1935, 77 y.o.   MRN: [id number]  HPI The PT is here for follow up and re-evaluation of chronic medical conditions, medication management and review of any available recent lab and radiology data.  Preventive health is updated, specifically  Cancer screening and Immunization.Behind with colonoscopy and mammogram , will send message re mammogram via nurse then discuss colonoscopy next viist Questions or concerns regarding consultations or procedures which the PT has had in the interim are  Addressed.Follows with nephrology and receives procrit locally for anemia. No change in decision re dialysis The PT denies any adverse reactions to current medications since the last visit.  Poor sleep remains a problem, grief is lessening, however no day passes still when she does not "shed a tear " over the loss of her terminally ill spouse several months ago  Ongoing knee and back pain which limits mobility, no recent falls      Review of Systems See HPI Denies recent fever or chills. Denies sinus pressure, nasal congestion, ear pain or sore throat. Denies chest congestion, productive cough or wheezing. Denies chest pains, palpitations and leg swelling Denies abdominal pain, nausea, vomiting,diarrhea or constipation.   Denies dysuria, frequency, hesitancy or incontinence.  Denies headaches, seizures, numbness, or tingling.  Denies skin break down or rash.        Objective:   Physical Exam  Patient alert and oriented and in no cardiopulmonary distress.  HEENT: No facial asymmetry, EOMI, no sinus tenderness,  oropharynx pink and moist.  Neck supple no adenopathy.  Chest: Clear to auscultation bilaterally.  CVS: S1, S2 no murmurs, no S3.  ABD: Soft non tender.  Ext: No edema  MS: decreased  ROM spine, shoulders, hips and knees.  Skin: Intact, no ulcerations or rash noted.  Psych: Good eye contact, normal affect.  Memory intact not anxious mildly  depressed appearing.  CNS: CN 2-12 intact, power,  normal throughout.       Assessment & Plan:

## 2013-03-04 ENCOUNTER — Other Ambulatory Visit: Payer: Self-pay

## 2013-03-04 ENCOUNTER — Encounter (HOSPITAL_BASED_OUTPATIENT_CLINIC_OR_DEPARTMENT_OTHER): Payer: Medicare Other

## 2013-03-04 VITALS — BP 163/72 | HR 74 | Resp 18

## 2013-03-04 DIAGNOSIS — D649 Anemia, unspecified: Secondary | ICD-10-CM

## 2013-03-04 DIAGNOSIS — D631 Anemia in chronic kidney disease: Secondary | ICD-10-CM

## 2013-03-04 DIAGNOSIS — N186 End stage renal disease: Secondary | ICD-10-CM

## 2013-03-04 LAB — CBC
Hemoglobin: 10.6 g/dL — ABNORMAL LOW (ref 12.0–15.0)
RBC: 4.15 MIL/uL (ref 3.87–5.11)
WBC: 11.7 10*3/uL — ABNORMAL HIGH (ref 4.0–10.5)

## 2013-03-04 MED ORDER — HYDROCODONE-ACETAMINOPHEN 7.5-325 MG PO TABS: ORAL_TABLET | ORAL | Status: DC

## 2013-03-04 MED ORDER — EPOETIN ALFA 40000 UNIT/ML IJ SOLN
INTRAMUSCULAR | Status: AC
  Filled 2013-03-04: qty 1

## 2013-03-04 MED ORDER — EPOETIN ALFA 40000 UNIT/ML IJ SOLN
40000.0000 [IU] | Freq: Once | INTRAMUSCULAR | Status: AC
  Administered 2013-03-04: 40000 [IU] via SUBCUTANEOUS

## 2013-03-04 NOTE — Progress Notes (Signed)
Terri Kelly presents today for injection per MD orders. Procrit 40000 units administered SQ in right Abdomen. Administration without incident. Patient tolerated well.  

## 2013-03-06 ENCOUNTER — Telehealth: Payer: Self-pay | Admitting: Family Medicine

## 2013-03-06 NOTE — Assessment & Plan Note (Signed)
Hyperlipidemia:Low fat diet discussed and encouraged.  Updated lab due 

## 2013-03-06 NOTE — Assessment & Plan Note (Signed)
Severe, no h/o fall since last visit Ongoing chronic pain med as before

## 2013-03-06 NOTE — Assessment & Plan Note (Signed)
Sleep hygiene reviewed. Trial of low dose restoril

## 2013-03-06 NOTE — Telephone Encounter (Signed)
Pls contact pt and/or daughter responsible for decision makin see if they want a mammogram, none since 2012. Pls document response in record  Let me know if they do so I can enter order also pls

## 2013-03-06 NOTE — Assessment & Plan Note (Signed)
Improving ability to cope, with the very close support of her daughters

## 2013-03-06 NOTE — Assessment & Plan Note (Signed)
Updated lab needed.  

## 2013-03-06 NOTE — Assessment & Plan Note (Signed)
Resolved and controlled on current medication Low sodium and limitation in fluid intake stressed

## 2013-03-06 NOTE — Assessment & Plan Note (Signed)
Improved. Pt applauded on succesful weight loss through lifestyle change, and encouraged to continue same. Weight loss goal set for the next several months.  

## 2013-03-06 NOTE — Assessment & Plan Note (Signed)
Updated  Lab needed, will draw early January Encouraged to continue to reduce intake of carbs and sweets

## 2013-03-06 NOTE — Assessment & Plan Note (Signed)
Controlled, no change in medication  

## 2013-03-06 NOTE — Assessment & Plan Note (Signed)
Followed by nephrology, pt refuses dialysis Ongoing procrit in hematology clinic for anemia due to CKD

## 2013-03-07 ENCOUNTER — Other Ambulatory Visit: Payer: Self-pay | Admitting: Family Medicine

## 2013-03-07 DIAGNOSIS — Z1239 Encounter for other screening for malignant neoplasm of breast: Secondary | ICD-10-CM

## 2013-03-07 NOTE — Telephone Encounter (Signed)
Daughter clarified

## 2013-03-11 ENCOUNTER — Ambulatory Visit (HOSPITAL_COMMUNITY): Payer: Medicare Other

## 2013-03-16 ENCOUNTER — Telehealth: Payer: Self-pay | Admitting: Family Medicine

## 2013-03-20 NOTE — Telephone Encounter (Signed)
pls verify with Ca that nephrology is original prescriber, I do not see it on her med list. If so, then order needs to be from her nephrologist pls advise family to contact nephrology if this is the case as I suspect as I generally do not prescribe this. Pls let me know if I am responsible  and verify prescribing info etc so new script requested can be sent

## 2013-03-21 ENCOUNTER — Other Ambulatory Visit: Payer: Self-pay

## 2013-03-21 DIAGNOSIS — E538 Deficiency of other specified B group vitamins: Secondary | ICD-10-CM

## 2013-03-21 DIAGNOSIS — K219 Gastro-esophageal reflux disease without esophagitis: Secondary | ICD-10-CM

## 2013-03-21 MED ORDER — OMEPRAZOLE 20 MG PO CPDR: 20.0000 mg | DELAYED_RELEASE_CAPSULE | Freq: Every day | ORAL | Status: DC

## 2013-03-21 MED ORDER — LOVASTATIN 40 MG PO TABS: 80.0000 mg | ORAL_TABLET | Freq: Every day | ORAL | Status: DC

## 2013-03-21 MED ORDER — LEVOTHYROXINE SODIUM 25 MCG PO TABS: 25.0000 ug | ORAL_TABLET | Freq: Every day | ORAL | Status: DC

## 2013-03-21 MED ORDER — AMLODIPINE BESYLATE 10 MG PO TABS: 10.0000 mg | ORAL_TABLET | Freq: Every day | ORAL | Status: AC

## 2013-03-21 MED ORDER — LABETALOL HCL 200 MG PO TABS: 200.0000 mg | ORAL_TABLET | Freq: Two times a day (BID) | ORAL | Status: AC

## 2013-03-21 MED ORDER — FOLIC ACID 1 MG PO TABS: 1.0000 mg | ORAL_TABLET | Freq: Every day | ORAL | Status: DC

## 2013-03-22 ENCOUNTER — Telehealth: Payer: Self-pay | Admitting: Family Medicine

## 2013-03-22 NOTE — Telephone Encounter (Signed)
Must have medicaid to get this assistance. Called daughter back and left message to call her back

## 2013-03-22 NOTE — Telephone Encounter (Signed)
Daughter aware not available with medicare only and to check with insurance about possible programs they cover

## 2013-03-25 ENCOUNTER — Encounter (HOSPITAL_COMMUNITY): Payer: Self-pay

## 2013-03-25 ENCOUNTER — Telehealth: Payer: Self-pay | Admitting: Family Medicine

## 2013-03-25 ENCOUNTER — Other Ambulatory Visit: Payer: Self-pay

## 2013-03-25 ENCOUNTER — Encounter (HOSPITAL_COMMUNITY): Payer: Medicare Other | Attending: Hematology and Oncology

## 2013-03-25 VITALS — BP 168/79 | HR 63 | Temp 97.6°F | Resp 18 | Wt 216.3 lb

## 2013-03-25 DIAGNOSIS — D631 Anemia in chronic kidney disease: Secondary | ICD-10-CM

## 2013-03-25 DIAGNOSIS — N2581 Secondary hyperparathyroidism of renal origin: Secondary | ICD-10-CM

## 2013-03-25 DIAGNOSIS — N039 Chronic nephritic syndrome with unspecified morphologic changes: Secondary | ICD-10-CM

## 2013-03-25 DIAGNOSIS — N186 End stage renal disease: Secondary | ICD-10-CM

## 2013-03-25 DIAGNOSIS — R946 Abnormal results of thyroid function studies: Secondary | ICD-10-CM

## 2013-03-25 DIAGNOSIS — G4700 Insomnia, unspecified: Secondary | ICD-10-CM

## 2013-03-25 DIAGNOSIS — N189 Chronic kidney disease, unspecified: Secondary | ICD-10-CM | POA: Insufficient documentation

## 2013-03-25 DIAGNOSIS — D649 Anemia, unspecified: Secondary | ICD-10-CM

## 2013-03-25 LAB — LIPID PANEL
CHOLESTEROL: 122 mg/dL (ref 0–200)
HDL: 42 mg/dL (ref >39)
LDL Cholesterol: 58 mg/dL (ref 0–99)
TRIGLYCERIDES: 112 mg/dL (ref <150)
Total CHOL/HDL Ratio: 2.9 RATIO
VLDL: 22 mg/dL (ref 0–40)

## 2013-03-25 LAB — CBC
HCT: 33.5 % — ABNORMAL LOW (ref 36.0–46.0)
Hemoglobin: 10.4 g/dL — ABNORMAL LOW (ref 12.0–15.0)
MCH: 24.6 pg — ABNORMAL LOW (ref 26.0–34.0)
MCHC: 31 g/dL (ref 30.0–36.0)
MCV: 79.2 fL (ref 78.0–100.0)
Platelets: 255 10*3/uL (ref 150–400)
RBC: 4.23 MIL/uL (ref 3.87–5.11)
RDW: 17 % — ABNORMAL HIGH (ref 11.5–15.5)
WBC: 9.4 10*3/uL (ref 4.0–10.5)

## 2013-03-25 MED ORDER — TEMAZEPAM 30 MG PO CAPS: 15.0000 mg | ORAL_CAPSULE | Freq: Every evening | ORAL | Status: DC | PRN

## 2013-03-25 MED ORDER — EPOETIN ALFA 40000 UNIT/ML IJ SOLN
INTRAMUSCULAR | Status: AC
  Filled 2013-03-25: qty 1

## 2013-03-25 MED ORDER — EPOETIN ALFA 40000 UNIT/ML IJ SOLN
40000.0000 [IU] | Freq: Once | INTRAMUSCULAR | Status: AC
  Administered 2013-03-25: 40000 [IU] via SUBCUTANEOUS

## 2013-03-25 NOTE — Telephone Encounter (Signed)
Daughter made aware that she should only take one of the new rx as it is 30mg 

## 2013-03-25 NOTE — Progress Notes (Signed)
[facility]  OFFICE PROGRESS NOTE  Terri OvermanMargaret Simpson, MD [address], [address] [place] [id number]  DIAGNOSIS: Anemia in chronic kidney disease - Plan: Ferritin, Lipid panel, TSH, Hemoglobin A1c  ESRD (end stage renal disease) - Plan: CBC, Ferritin, Lipid panel, TSH, Hemoglobin A1c, epoetin alfa (EPOGEN,PROCRIT) injection 40,000 Units  Normocytic anemia - Plan: CBC, Ferritin, Lipid panel, TSH, Hemoglobin A1c, epoetin alfa (EPOGEN,PROCRIT) injection 40,000 Units  Chief Complaint  Patient presents with  . Anemia    In association with chronic renal disease    CURRENT THERAPY: Procrit with last treatment on 03/04/2013 at which time hemoglobin was 10.6.  INTERVAL HISTORY: Terri Kelly 78 y.o. female returns for followup of anemia in association with end-stage renal disease, receiving Procrit intermittently, last treatment on 03/04/2013.  She developed an upper respiratory infection from her great-grandchild and elected not to come to the clinic for fear that she would in fact someone. She continues to do well with good appetite and no nausea, vomiting, chest pain, PND, orthopnea, palpitations, diarrhea, incontinence, lower extremity swelling or redness, fever, night sweats, easy satiety, skin rash, headache, or seizures.  MEDICAL HISTORY: Past Medical History  Diagnosis Date  . Hyperlipidemia   . Obesity   . Hypertension   . Diabetes mellitus type 1   . Back pain   . Sleep apnea 2009    non compliant with c-pap  . Diastolic dysfunction Nov 2011    [facility]   . Normocytic anemia 04/09/2012  . Allergy   . Arthritis   . Chronic kidney disease     does not want HD    INTERIM HISTORY: has IGT (impaired glucose tolerance); HYPERLIPIDEMIA; OBESITY; HYPERTENSION; ESRD (end stage renal disease); HIP PAIN, LEFT; Osteoarthritis of both knees; BACK PAIN; DISORDER OF BONE AND CARTILAGE UNSPECIFIED; FATIGUE; ABNORMAL  THYROID FUNCTION TESTS; Hyperparathyroidism, secondary renal; Normocytic anemia; Neck muscle spasm; Grief; Hypocalcemia; Hypomagnesemia; Prolonged Q-T interval on ECG; Acute on chronic diastolic CHF (congestive heart failure); Anemia in chronic kidney disease; GERD (gastroesophageal reflux disease); OA (osteoarthritis) of knee; Knee pain; Anemia in chronic kidney disease(285.21); and Insomnia on her problem list.    ALLERGIES:  is allergic to ace inhibitors; angiotensin receptor blockers; and other.  MEDICATIONS: has a current medication list which includes the following prescription(s): amlodipine, aspirin, calcitriol, calcium-vitamin d, diphenhydramine, folic acid, furosemide, hydralazine, hydrocodone-acetaminophen, labetalol, levothyroxine, lovastatin, omeprazole, sodium bicarbonate, temazepam, thiamine hcl, and diphenhydramine.  SURGICAL HISTORY:  Past Surgical History  Procedure Laterality Date  . Vaginal hysterectomy  1978  . Myomectomy  1978  . Right knee arthroscopy  1976  . Right knee replacement      FAMILY HISTORY: family history includes Heart disease in her father; Hypertension in her brother, brother, and sister; Kidney disease in her father; Kidney failure in her brother and sister.  SOCIAL HISTORY:  reports that she has quit smoking. She has never used smokeless tobacco. She reports that she does not drink alcohol or use illicit drugs.  REVIEW OF SYSTEMS:  Other than that discussed above is noncontributory.  PHYSICAL EXAMINATION: ECOG PERFORMANCE STATUS: 1 - Symptomatic but completely ambulatory  Blood pressure 168/79, pulse 63, temperature 97.6 F (36.4 C), temperature source Oral, resp. rate 18, weight 216 lb 4.8 oz (98.113 kg).  GENERAL:alert, no distress and comfortable SKIN: skin color, texture, turgor are normal, no rashes or significant lesions EYES: PERLA; Conjunctiva are pink and non-injected,  sclera clear OROPHARYNX:no exudate, no erythema on lips, buccal  mucosa, or tongue. NECK: supple, thyroid normal size, non-tender, without nodularity. No masses CHEST: Normal AP diameter with no breast masses. LYMPH:  no palpable lymphadenopathy in the cervical, axillary or inguinal LUNGS: clear to auscultation and percussion with normal breathing effort HEART: regular rate & rhythm and no murmurs. ABDOMEN:abdomen soft, non-tender and normal bowel sounds MUSCULOSKELETAL:no cyanosis of digits and no clubbing. Range of motion normal.  NEURO: alert & oriented x 3 with fluent speech, no focal motor/sensory deficits   LABORATORY DATA: Office Visit on 03/25/2013  Component Date Value Range Status  . WBC 03/25/2013 9.4  4.0 - 10.5 K/uL Final  . RBC 03/25/2013 4.23  3.87 - 5.11 MIL/uL Final  . Hemoglobin 03/25/2013 10.4* 12.0 - 15.0 g/dL Final  . HCT 16/12/9602 33.5* 36.0 - 46.0 % Final  . MCV 03/25/2013 79.2  78.0 - 100.0 fL Final  . MCH 03/25/2013 24.6* 26.0 - 34.0 pg Final  . MCHC 03/25/2013 31.0  30.0 - 36.0 g/dL Final  . RDW 54/11/8117 17.0* 11.5 - 15.5 % Final  . Platelets 03/25/2013 255  150 - 400 K/uL Final  . Cholesterol 03/25/2013 122  0 - 200 mg/dL Final  . Triglycerides 03/25/2013 112  <150 mg/dL Final  . HDL 14/78/2956 42  >39 mg/dL Final  . Total CHOL/HDL Ratio 03/25/2013 2.9   Final  . VLDL 03/25/2013 22  0 - 40 mg/dL Final  . LDL Cholesterol 03/25/2013 58  0 - 99 mg/dL Final   Comment:                                 Total Cholesterol/HDL:CHD Risk                          Coronary Heart Disease Risk Table                                              Men   Women                           1/2 Average Risk   3.4   3.3                           Average Risk       5.0   4.4                           2 X Average Risk   9.6   7.1                           3 X Average Risk  23.4   11.0                                                          Use the calculated Patient Ratio  above and the CHD Risk Table                           to determine the patient's CHD Risk.                                                          ATP III CLASSIFICATION (LDL):                           <100     mg/dL   Optimal                           100-129  mg/dL   Near or Above                                             Optimal                           130-159  mg/dL   Borderline                           160-189  mg/dL   High                           >190     mg/dL   Very High  Infusion on 03/04/2013  Component Date Value Range Status  . WBC 03/04/2013 11.7* 4.0 - 10.5 K/uL Final  . RBC 03/04/2013 4.15  3.87 - 5.11 MIL/uL Final  . Hemoglobin 03/04/2013 10.6* 12.0 - 15.0 g/dL Final  . HCT 16/12/9602 33.4* 36.0 - 46.0 % Final  . MCV 03/04/2013 80.5  78.0 - 100.0 fL Final  . MCH 03/04/2013 25.5* 26.0 - 34.0 pg Final  . MCHC 03/04/2013 31.7  30.0 - 36.0 g/dL Final  . RDW 54/11/8117 16.5* 11.5 - 15.5 % Final  . Platelets 03/04/2013 302  150 - 400 K/uL Final  Infusion on 02/25/2013  Component Date Value Range Status  . WBC 02/25/2013 9.8  4.0 - 10.5 K/uL Final  . RBC 02/25/2013 4.27  3.87 - 5.11 MIL/uL Final  . Hemoglobin 02/25/2013 11.0* 12.0 - 15.0 g/dL Final  . HCT 14/78/2956 34.5* 36.0 - 46.0 % Final  . MCV 02/25/2013 80.8  78.0 - 100.0 fL Final  . MCH 02/25/2013 25.8* 26.0 - 34.0 pg Final  . MCHC 02/25/2013 31.9  30.0 - 36.0 g/dL Final  . RDW 21/30/8657 15.9* 11.5 - 15.5 % Final  . Platelets 02/25/2013 241  150 - 400 K/uL Final    PATHOLOGY: No new pathology.  Urinalysis    Component Value Date/Time   COLORURINE YELLOW 09/27/2012 1238   APPEARANCEUR CLEAR 09/27/2012 1238   LABSPEC 1.020 09/27/2012 1238   PHURINE 7.0 09/27/2012 1238   GLUCOSEU NEGATIVE 09/27/2012 1238   HGBUR SMALL* 09/27/2012 1238   BILIRUBINUR NEGATIVE 09/27/2012 1238   KETONESUR NEGATIVE 09/27/2012 1238   PROTEINUR 100* 09/27/2012 1238   UROBILINOGEN 0.2 09/27/2012 1238   NITRITE  NEGATIVE 09/27/2012 1238   LEUKOCYTESUR NEGATIVE 09/27/2012 1238     RADIOGRAPHIC STUDIES: No results found.  ASSESSMENT:  #1.Anemia of chronic disease in the setting of stage IV renal disease, refusing dialysis, stable hemoglobin.  #2. End stage renal disease, stage IV with creatinine over 7.  #3. Obstructive sleep apnea syndrome, on CPAP.  #4. Diabetes mellitus, type II, controlled.  #5. Diastolic cardiac dysfunction. #6. Recent upper respiratory infection, resolved.   PLAN:  #1. Procrit subcutaneously if hemoglobin is less than 11 and repeat weekly with plans to reexamine in 6 weeks. Ferritin will be checked today and if inadequate, intravenous iron will be administered.   All questions were answered. The patient knows to call the clinic with any problems, questions or concerns. We can certainly see the patient much sooner if necessary.   I spent 25 minutes counseling the patient face to face. The total time spent in the appointment was 30 minutes.    Maurilio Lovely, MD 03/25/2013 3:59 PM

## 2013-03-25 NOTE — Telephone Encounter (Signed)
Noted in office visit that patient was advised that she can take two restoril if one not effective.  Dose increase entered for 30mg  capsule.

## 2013-03-25 NOTE — Progress Notes (Signed)
Philena C Templer presented for labwork. Labs per MD order drawn via Peripheral Line 23 gauge needle inserted in right AC  Good blood return present. Procedure without incident.  Needle removed intact. Patient tolerated procedure well.  Terri Kelly presents today for injection per MD orders. Procrit [id number] units administered SQ in right Abdomen. Administration without incident. Patient tolerated well.

## 2013-03-25 NOTE — Patient Instructions (Signed)
[facility] Discharge Instructions  RECOMMENDATIONS MADE BY THE CONSULTANT AND ANY TEST RESULTS WILL BE SENT TO YOUR REFERRING PHYSICIAN.  EXAM FINDINGS BY THE PHYSICIAN TODAY AND SIGNS OR SYMPTOMS TO REPORT TO CLINIC OR PRIMARY PHYSICIAN: Exam and findings as discussed by Dr. Zigmund DanielFormanek.  Will continue current regimen.  Report increased fatigue, shortness of breath or other problems.  MEDICATIONS PRESCRIBED:  none  INSTRUCTIONS/FOLLOW-UP: Labs and procrit weekly and MD visit in 6 weeks.  Thank you for choosing [facility] to provide your oncology and hematology care.  To afford each patient quality time with our providers, please arrive at least 15 minutes before your scheduled appointment time.  With your help, our goal is to use those 15 minutes to complete the necessary work-up to ensure our physicians have the information they need to help with your evaluation and healthcare recommendations.    Effective January 1st, 2014, we ask that you re-schedule your appointment with our physicians should you arrive 10 or more minutes late for your appointment.  We strive to give you quality time with our providers, and arriving late affects you and other patients whose appointments are after yours.    Again, thank you for choosing [facility].  Our hope is that these requests will decrease the amount of time that you wait before being seen by our physicians.       _____________________________________________________________  Should you have questions after your visit to [facility], please contact our office at [phone] between the hours of 8:30 a.m. and 5:00 p.m.  Voicemails left after 4:30 p.m. will not be returned until the following business day.  For prescription refill requests, have your pharmacy contact our office with your prescription refill request.

## 2013-03-26 LAB — HEMOGLOBIN A1C
Hgb A1c MFr Bld: 5.7 % — ABNORMAL HIGH (ref <5.7)
MEAN PLASMA GLUCOSE: 117 mg/dL — AB (ref <117)

## 2013-03-26 LAB — TSH: TSH: 9.489 u[IU]/mL — ABNORMAL HIGH (ref 0.350–4.500)

## 2013-03-27 ENCOUNTER — Other Ambulatory Visit: Payer: Self-pay | Admitting: Family Medicine

## 2013-03-29 ENCOUNTER — Other Ambulatory Visit: Payer: Self-pay

## 2013-03-29 MED ORDER — HYDRALAZINE HCL 50 MG PO TABS: ORAL_TABLET | ORAL | Status: DC

## 2013-03-29 MED ORDER — FUROSEMIDE 80 MG PO TABS: 80.0000 mg | ORAL_TABLET | Freq: Two times a day (BID) | ORAL | Status: DC

## 2013-03-31 LAB — T3, FREE: T3, Free: 1.9 pg/mL — ABNORMAL LOW (ref 2.3–4.2)

## 2013-03-31 LAB — T4, FREE: Free T4: 0.74 ng/dL — ABNORMAL LOW (ref 0.80–1.80)

## 2013-03-31 NOTE — Addendum Note (Signed)
Addended by: Kandis FantasiaSLADE, COURTNEY B on: 03/31/2013 10:44 AM   Modules accepted: Orders

## 2013-04-01 ENCOUNTER — Other Ambulatory Visit: Payer: Self-pay

## 2013-04-01 ENCOUNTER — Encounter (HOSPITAL_COMMUNITY): Payer: Medicare Other

## 2013-04-01 MED ORDER — LEVOTHYROXINE SODIUM 50 MCG PO TABS: 50.0000 ug | ORAL_TABLET | Freq: Every day | ORAL | Status: DC

## 2013-04-01 NOTE — Addendum Note (Signed)
Addended by: Kandis FantasiaSLADE, COURTNEY B on: 04/01/2013 11:17 AM   Modules accepted: Orders

## 2013-04-08 ENCOUNTER — Encounter (HOSPITAL_COMMUNITY): Payer: Medicare Other

## 2013-04-08 ENCOUNTER — Encounter (HOSPITAL_BASED_OUTPATIENT_CLINIC_OR_DEPARTMENT_OTHER): Payer: Medicare Other

## 2013-04-08 DIAGNOSIS — D631 Anemia in chronic kidney disease: Secondary | ICD-10-CM

## 2013-04-08 DIAGNOSIS — N186 End stage renal disease: Secondary | ICD-10-CM

## 2013-04-08 DIAGNOSIS — N039 Chronic nephritic syndrome with unspecified morphologic changes: Secondary | ICD-10-CM

## 2013-04-08 DIAGNOSIS — D649 Anemia, unspecified: Secondary | ICD-10-CM

## 2013-04-08 LAB — CBC
HCT: 32.1 % — ABNORMAL LOW (ref 36.0–46.0)
Hemoglobin: 10.5 g/dL — ABNORMAL LOW (ref 12.0–15.0)
MCH: 25.7 pg — AB (ref 26.0–34.0)
MCHC: 32.7 g/dL (ref 30.0–36.0)
MCV: 78.5 fL (ref 78.0–100.0)
Platelets: 235 10*3/uL (ref 150–400)
RBC: 4.09 MIL/uL (ref 3.87–5.11)
RDW: 17.9 % — ABNORMAL HIGH (ref 11.5–15.5)
WBC: 9.6 10*3/uL (ref 4.0–10.5)

## 2013-04-08 MED ORDER — EPOETIN ALFA 40000 UNIT/ML IJ SOLN
40000.0000 [IU] | Freq: Once | INTRAMUSCULAR | Status: AC
  Administered 2013-04-08: 40000 [IU] via SUBCUTANEOUS

## 2013-04-08 MED ORDER — EPOETIN ALFA 40000 UNIT/ML IJ SOLN
INTRAMUSCULAR | Status: AC
  Filled 2013-04-08: qty 1

## 2013-04-08 NOTE — Progress Notes (Unsigned)
Terri Kelly presented for labwork. Labs per MD order drawn via Peripheral Line 23 gauge needle inserted in right AC  Good blood return present. Procedure without incident.  Needle removed intact. Patient tolerated procedure well.

## 2013-04-08 NOTE — Progress Notes (Signed)
Terri Kelly presents today for injection per MD orders. Procrit 40000 units administered SQ in right Abdomen. Administration without incident. Patient tolerated well.  

## 2013-04-11 ENCOUNTER — Ambulatory Visit (HOSPITAL_COMMUNITY): Payer: Medicare Other

## 2013-04-13 ENCOUNTER — Other Ambulatory Visit: Payer: Self-pay

## 2013-04-13 MED ORDER — HYDROCODONE-ACETAMINOPHEN 7.5-325 MG PO TABS: ORAL_TABLET | ORAL | Status: DC

## 2013-04-15 ENCOUNTER — Encounter (HOSPITAL_BASED_OUTPATIENT_CLINIC_OR_DEPARTMENT_OTHER): Payer: Medicare Other

## 2013-04-15 ENCOUNTER — Encounter (HOSPITAL_COMMUNITY): Payer: Medicare Other

## 2013-04-15 DIAGNOSIS — D649 Anemia, unspecified: Secondary | ICD-10-CM

## 2013-04-15 DIAGNOSIS — N186 End stage renal disease: Secondary | ICD-10-CM

## 2013-04-15 DIAGNOSIS — D631 Anemia in chronic kidney disease: Secondary | ICD-10-CM

## 2013-04-15 DIAGNOSIS — N189 Chronic kidney disease, unspecified: Principal | ICD-10-CM

## 2013-04-15 LAB — CBC
HCT: 34.2 % — ABNORMAL LOW (ref 36.0–46.0)
Hemoglobin: 10.7 g/dL — ABNORMAL LOW (ref 12.0–15.0)
MCH: 24.4 pg — ABNORMAL LOW (ref 26.0–34.0)
MCHC: 31.3 g/dL (ref 30.0–36.0)
MCV: 78.1 fL (ref 78.0–100.0)
Platelets: 321 10*3/uL (ref 150–400)
RBC: 4.38 MIL/uL (ref 3.87–5.11)
RDW: 18.7 % — ABNORMAL HIGH (ref 11.5–15.5)
WBC: 12 10*3/uL — ABNORMAL HIGH (ref 4.0–10.5)

## 2013-04-15 MED ORDER — EPOETIN ALFA 40000 UNIT/ML IJ SOLN
INTRAMUSCULAR | Status: AC
  Filled 2013-04-15: qty 1

## 2013-04-15 MED ORDER — EPOETIN ALFA 40000 UNIT/ML IJ SOLN
40000.0000 [IU] | Freq: Once | INTRAMUSCULAR | Status: AC
  Administered 2013-04-15: 40000 [IU] via SUBCUTANEOUS

## 2013-04-15 NOTE — Progress Notes (Signed)
Terri Kelly presented for labwork. Labs per MD order drawn via Peripheral Line 23 gauge needle inserted in right antecubital.  Good blood return present. Procedure without incident.  Needle removed intact. Patient tolerated procedure well.  Terri Kelly presents today for injection per MD orders. Procrit 40,000 units administered SQ in right lower abdomen. Administration without incident. Patient tolerated well.

## 2013-04-16 LAB — FERRITIN: FERRITIN: 99 ng/mL (ref 10–291)

## 2013-04-22 ENCOUNTER — Encounter (HOSPITAL_BASED_OUTPATIENT_CLINIC_OR_DEPARTMENT_OTHER): Payer: Medicare Other

## 2013-04-22 ENCOUNTER — Encounter (HOSPITAL_COMMUNITY): Payer: Medicare Other

## 2013-04-22 VITALS — BP 199/81 | HR 61 | Resp 16

## 2013-04-22 DIAGNOSIS — N039 Chronic nephritic syndrome with unspecified morphologic changes: Secondary | ICD-10-CM

## 2013-04-22 DIAGNOSIS — D649 Anemia, unspecified: Secondary | ICD-10-CM

## 2013-04-22 DIAGNOSIS — N186 End stage renal disease: Secondary | ICD-10-CM

## 2013-04-22 DIAGNOSIS — D631 Anemia in chronic kidney disease: Secondary | ICD-10-CM

## 2013-04-22 LAB — CBC
HEMATOCRIT: 32.9 % — AB (ref 36.0–46.0)
HEMOGLOBIN: 10.2 g/dL — AB (ref 12.0–15.0)
MCH: 24.5 pg — ABNORMAL LOW (ref 26.0–34.0)
MCHC: 31 g/dL (ref 30.0–36.0)
MCV: 78.9 fL (ref 78.0–100.0)
Platelets: 298 10*3/uL (ref 150–400)
RBC: 4.17 MIL/uL (ref 3.87–5.11)
RDW: 19.8 % — AB (ref 11.5–15.5)
WBC: 11.4 10*3/uL — ABNORMAL HIGH (ref 4.0–10.5)

## 2013-04-22 MED ORDER — EPOETIN ALFA 40000 UNIT/ML IJ SOLN
40000.0000 [IU] | Freq: Once | INTRAMUSCULAR | Status: AC
  Administered 2013-04-22: 40000 [IU] via SUBCUTANEOUS
  Filled 2013-04-22: qty 1

## 2013-04-22 NOTE — Progress Notes (Signed)
Terri Kelly's reason for visit today is for an injection and labs as scheduled per MD orders.  Labs were drawn prior to administration of ordered medication.  Venipuncture performed with a 23 gauge butterfly needle to R Antecubital.  Terri Kelly also received Procrit per MD orders; see MAR for administration details.  Terri Kelly tolerated all procedures well and without incident; questions were answered and patient was discharged.

## 2013-04-29 ENCOUNTER — Encounter (HOSPITAL_BASED_OUTPATIENT_CLINIC_OR_DEPARTMENT_OTHER): Payer: Medicare Other

## 2013-04-29 ENCOUNTER — Encounter (HOSPITAL_COMMUNITY): Payer: Medicare Other | Attending: Hematology and Oncology

## 2013-04-29 DIAGNOSIS — N186 End stage renal disease: Secondary | ICD-10-CM | POA: Insufficient documentation

## 2013-04-29 DIAGNOSIS — D649 Anemia, unspecified: Secondary | ICD-10-CM | POA: Insufficient documentation

## 2013-04-29 DIAGNOSIS — D631 Anemia in chronic kidney disease: Secondary | ICD-10-CM

## 2013-04-29 DIAGNOSIS — N039 Chronic nephritic syndrome with unspecified morphologic changes: Secondary | ICD-10-CM

## 2013-04-29 LAB — CBC
HCT: 34.7 % — ABNORMAL LOW (ref 36.0–46.0)
Hemoglobin: 10.5 g/dL — ABNORMAL LOW (ref 12.0–15.0)
MCH: 24 pg — AB (ref 26.0–34.0)
MCHC: 30.3 g/dL (ref 30.0–36.0)
MCV: 79.4 fL (ref 78.0–100.0)
PLATELETS: 322 10*3/uL (ref 150–400)
RBC: 4.37 MIL/uL (ref 3.87–5.11)
RDW: 20.7 % — ABNORMAL HIGH (ref 11.5–15.5)
WBC: 10.7 10*3/uL — ABNORMAL HIGH (ref 4.0–10.5)

## 2013-04-29 MED ORDER — EPOETIN ALFA 40000 UNIT/ML IJ SOLN
40000.0000 [IU] | Freq: Once | INTRAMUSCULAR | Status: AC
  Administered 2013-04-29: 40000 [IU] via SUBCUTANEOUS

## 2013-04-29 MED ORDER — EPOETIN ALFA 40000 UNIT/ML IJ SOLN
INTRAMUSCULAR | Status: AC
  Filled 2013-04-29: qty 1

## 2013-04-29 NOTE — Progress Notes (Signed)
Terri Kelly presented for labwork. Labs per MD order drawn via Peripheral Line 25 gauge needle inserted in rt ac.  Good blood return present. Procedure without incident.  Needle removed intact. Patient tolerated procedure well.   

## 2013-04-29 NOTE — Progress Notes (Signed)
Terri Kelly presents today for injection per MD orders. Procrit 40000 units administered SQ in right Abdomen. Administration without incident. Patient tolerated well.  

## 2013-05-06 ENCOUNTER — Other Ambulatory Visit (HOSPITAL_COMMUNITY): Payer: Medicare Other

## 2013-05-06 ENCOUNTER — Other Ambulatory Visit: Payer: Self-pay

## 2013-05-06 ENCOUNTER — Encounter (HOSPITAL_COMMUNITY): Payer: Medicare Other

## 2013-05-06 ENCOUNTER — Ambulatory Visit (HOSPITAL_COMMUNITY): Payer: Medicare Other

## 2013-05-06 MED ORDER — HYDROCODONE-ACETAMINOPHEN 7.5-325 MG PO TABS: ORAL_TABLET | ORAL | Status: DC

## 2013-05-13 ENCOUNTER — Encounter (HOSPITAL_COMMUNITY): Payer: Self-pay

## 2013-05-13 ENCOUNTER — Encounter (HOSPITAL_COMMUNITY): Payer: Medicare Other | Attending: Hematology and Oncology

## 2013-05-13 ENCOUNTER — Encounter (HOSPITAL_COMMUNITY): Payer: Medicare Other

## 2013-05-13 ENCOUNTER — Other Ambulatory Visit (HOSPITAL_COMMUNITY): Payer: Medicare Other

## 2013-05-13 ENCOUNTER — Ambulatory Visit (HOSPITAL_COMMUNITY): Payer: Medicare Other

## 2013-05-13 DIAGNOSIS — D649 Anemia, unspecified: Secondary | ICD-10-CM | POA: Insufficient documentation

## 2013-05-13 DIAGNOSIS — N186 End stage renal disease: Secondary | ICD-10-CM | POA: Insufficient documentation

## 2013-05-14 ENCOUNTER — Other Ambulatory Visit: Payer: Self-pay | Admitting: Family Medicine

## 2013-05-20 ENCOUNTER — Encounter (HOSPITAL_COMMUNITY): Payer: Medicare Other

## 2013-05-20 ENCOUNTER — Encounter (HOSPITAL_BASED_OUTPATIENT_CLINIC_OR_DEPARTMENT_OTHER): Payer: Medicare Other

## 2013-05-20 VITALS — BP 198/88 | HR 65

## 2013-05-20 DIAGNOSIS — D649 Anemia, unspecified: Secondary | ICD-10-CM

## 2013-05-20 DIAGNOSIS — N186 End stage renal disease: Secondary | ICD-10-CM

## 2013-05-20 LAB — CBC
HCT: 31.2 % — ABNORMAL LOW (ref 36.0–46.0)
Hemoglobin: 9.7 g/dL — ABNORMAL LOW (ref 12.0–15.0)
MCH: 24.3 pg — ABNORMAL LOW (ref 26.0–34.0)
MCHC: 31.1 g/dL (ref 30.0–36.0)
MCV: 78.2 fL (ref 78.0–100.0)
Platelets: 228 10*3/uL (ref 150–400)
RBC: 3.99 MIL/uL (ref 3.87–5.11)
RDW: 19.6 % — AB (ref 11.5–15.5)
WBC: 9.2 10*3/uL (ref 4.0–10.5)

## 2013-05-20 MED ORDER — EPOETIN ALFA 40000 UNIT/ML IJ SOLN
40000.0000 [IU] | Freq: Once | INTRAMUSCULAR | Status: AC
  Administered 2013-05-20: 40000 [IU] via SUBCUTANEOUS

## 2013-05-20 MED ORDER — EPOETIN ALFA 40000 UNIT/ML IJ SOLN
INTRAMUSCULAR | Status: AC
  Filled 2013-05-20: qty 1

## 2013-05-20 NOTE — Progress Notes (Signed)
Alicen C Selvy presented for labwork. Labs per MD order drawn via Peripheral Line 25 gauge needle inserted in rt ac.  Good blood return present. Procedure without incident.  Needle removed intact. Patient tolerated procedure well. Terri Kelly presents today for injection per MD orders. Procrit [id number] administered SQ in right Abdomen. Administration without incident. Patient tolerated well.

## 2013-05-27 ENCOUNTER — Encounter (HOSPITAL_COMMUNITY): Payer: Medicare Other | Attending: Hematology and Oncology

## 2013-05-27 ENCOUNTER — Encounter (HOSPITAL_COMMUNITY): Payer: Self-pay

## 2013-05-27 ENCOUNTER — Encounter (HOSPITAL_COMMUNITY): Payer: Medicare Other

## 2013-05-27 VITALS — BP 171/78 | HR 65 | Temp 97.9°F | Resp 20 | Wt 218.3 lb

## 2013-05-27 DIAGNOSIS — N2581 Secondary hyperparathyroidism of renal origin: Secondary | ICD-10-CM

## 2013-05-27 DIAGNOSIS — N189 Chronic kidney disease, unspecified: Secondary | ICD-10-CM | POA: Insufficient documentation

## 2013-05-27 DIAGNOSIS — D649 Anemia, unspecified: Secondary | ICD-10-CM

## 2013-05-27 DIAGNOSIS — Z5320 Procedure and treatment not carried out because of patient's decision for unspecified reasons: Secondary | ICD-10-CM

## 2013-05-27 DIAGNOSIS — N184 Chronic kidney disease, stage 4 (severe): Secondary | ICD-10-CM

## 2013-05-27 DIAGNOSIS — D631 Anemia in chronic kidney disease: Secondary | ICD-10-CM

## 2013-05-27 DIAGNOSIS — I519 Heart disease, unspecified: Secondary | ICD-10-CM

## 2013-05-27 DIAGNOSIS — N186 End stage renal disease: Secondary | ICD-10-CM

## 2013-05-27 DIAGNOSIS — I10 Essential (primary) hypertension: Secondary | ICD-10-CM

## 2013-05-27 DIAGNOSIS — E119 Type 2 diabetes mellitus without complications: Secondary | ICD-10-CM

## 2013-05-27 DIAGNOSIS — N039 Chronic nephritic syndrome with unspecified morphologic changes: Principal | ICD-10-CM

## 2013-05-27 LAB — CBC WITH DIFFERENTIAL/PLATELET
BASOS PCT: 1 % (ref 0–1)
Basophils Absolute: 0.1 10*3/uL (ref 0.0–0.1)
EOS ABS: 0.4 10*3/uL (ref 0.0–0.7)
Eosinophils Relative: 4 % (ref 0–5)
HCT: 30 % — ABNORMAL LOW (ref 36.0–46.0)
Hemoglobin: 9.7 g/dL — ABNORMAL LOW (ref 12.0–15.0)
Lymphocytes Relative: 17 % (ref 12–46)
Lymphs Abs: 1.7 10*3/uL (ref 0.7–4.0)
MCH: 25.4 pg — ABNORMAL LOW (ref 26.0–34.0)
MCHC: 32.3 g/dL (ref 30.0–36.0)
MCV: 78.5 fL (ref 78.0–100.0)
Monocytes Absolute: 0.8 10*3/uL (ref 0.1–1.0)
Monocytes Relative: 7 % (ref 3–12)
NEUTROS PCT: 71 % (ref 43–77)
Neutro Abs: 7.2 10*3/uL (ref 1.7–7.7)
PLATELETS: 242 10*3/uL (ref 150–400)
RBC: 3.82 MIL/uL — ABNORMAL LOW (ref 3.87–5.11)
RDW: 19.8 % — AB (ref 11.5–15.5)
WBC: 10.1 10*3/uL (ref 4.0–10.5)

## 2013-05-27 MED ORDER — EPOETIN ALFA 40000 UNIT/ML IJ SOLN
40000.0000 [IU] | Freq: Once | INTRAMUSCULAR | Status: AC
  Administered 2013-05-27: 40000 [IU] via SUBCUTANEOUS

## 2013-05-27 MED ORDER — EPOETIN ALFA 40000 UNIT/ML IJ SOLN
INTRAMUSCULAR | Status: AC
  Filled 2013-05-27: qty 1

## 2013-05-27 NOTE — Patient Instructions (Signed)
[facility] Discharge Instructions  RECOMMENDATIONS MADE BY THE CONSULTANT AND ANY TEST RESULTS WILL BE SENT TO YOUR REFERRING PHYSICIAN.  Return in 3 weeks for CBC and Procrit (will depend on lab results) We will see you in 6 weeks for doctor appointment and repeat CBC, Ferritin, and Procrit (will depend on lab results.)   Thank you for choosing [facility] to provide your oncology and hematology care.  To afford each patient quality time with our providers, please arrive at least 15 minutes before your scheduled appointment time.  With your help, our goal is to use those 15 minutes to complete the necessary work-up to ensure our physicians have the information they need to help with your evaluation and healthcare recommendations.    Effective January 1st, 2014, we ask that you re-schedule your appointment with our physicians should you arrive 10 or more minutes late for your appointment.  We strive to give you quality time with our providers, and arriving late affects you and other patients whose appointments are after yours.    Again, thank you for choosing [facility].  Our hope is that these requests will decrease the amount of time that you wait before being seen by our physicians.       _____________________________________________________________  Should you have questions after your visit to [facility], please contact our office at [phone] between the hours of 8:30 a.m. and 5:00 p.m.  Voicemails left after 4:30 p.m. will not be returned until the following business day.  For prescription refill requests, have your pharmacy contact our office with your prescription refill request.

## 2013-05-27 NOTE — Progress Notes (Signed)
[facility]  OFFICE PROGRESS NOTE  Terri Overman, MD [address] [place] [address]  DIAGNOSIS: Anemia in chronic kidney disease - Plan: CBC with Differential, Ferritin, CBC with Differential, Ferritin, CBC with Differential, Ferritin  ESRD (end stage renal disease) - Plan: CBC with Differential, Ferritin, SCHEDULING COMMUNICATION INJECTION, PROCRIT TREATMENT CONDITION, SCHEDULING COMMUNICATION LAB, epoetin alfa (EPOGEN,PROCRIT) injection 40,000 Units, PROCRIT TREATMENT CONDITION  Hyperparathyroidism, secondary renal - Plan: CBC with Differential, Ferritin  Normocytic anemia - Plan: SCHEDULING COMMUNICATION INJECTION, PROCRIT TREATMENT CONDITION, SCHEDULING COMMUNICATION LAB, epoetin alfa (EPOGEN,PROCRIT) injection 40,000 Units, PROCRIT TREATMENT CONDITION  Chief Complaint  Patient presents with  . Anemia in chronic kidney disease  . End-stage renal disease, refusing dialysis    CURRENT THERAPY: Procrit every 3 weeks for anemia in chronic kidney disease.  INTERVAL HISTORY: Terri Kelly 78 y.o. female returns for followup a receiving Procrit intermittently for anemia in association with chronic kidney disease, currently end-stage, refusing dialysis.  She continues to do amazingly well with educational lower 70 swelling but no nausea, vomiting, chest pain, PND, orthopnea, abdominal pain, urinary hesitancy, hematuria, incontinence, constipation, diarrhea, joint pain, headache, or seizures.  MEDICAL HISTORY: Past Medical History  Diagnosis Date  . Hyperlipidemia   . Obesity   . Hypertension   . Diabetes mellitus type 1   . Back pain   . Sleep apnea 2009    non compliant with c-pap  . Diastolic dysfunction Nov 2011    Hospitalised for 3 days at [facility]   . Normocytic anemia 04/09/2012  . Allergy   . Arthritis   . Chronic kidney disease     does not want HD    INTERIM HISTORY: has IGT (impaired glucose tolerance);  HYPERLIPIDEMIA; OBESITY; HYPERTENSION; ESRD (end stage renal disease); HIP PAIN, LEFT; Osteoarthritis of both knees; BACK PAIN; DISORDER OF BONE AND CARTILAGE UNSPECIFIED; FATIGUE; ABNORMAL THYROID FUNCTION TESTS; Hyperparathyroidism, secondary renal; Normocytic anemia; Neck muscle spasm; Grief; Hypocalcemia; Hypomagnesemia; Prolonged Q-T interval on ECG; Acute on chronic diastolic CHF (congestive heart failure); Anemia in chronic kidney disease; GERD (gastroesophageal reflux disease); OA (osteoarthritis) of knee; Knee pain; Anemia in chronic kidney disease(285.21); and Insomnia on her problem list.    ALLERGIES:  is allergic to ace inhibitors; angiotensin receptor blockers; and other.  MEDICATIONS: has a current medication list which includes the following prescription(s): amlodipine, aspirin, calcium-vitamin d, folic acid, furosemide, hydralazine, hydrocodone-acetaminophen, labetalol, levothyroxine, lovastatin, omeprazole, temazepam, calcitriol, diphenhydramine, diphenhydramine, sodium bicarbonate, and thiamine hcl.  SURGICAL HISTORY:  Past Surgical History  Procedure Laterality Date  . Vaginal hysterectomy  1978  . Myomectomy  1978  . Right knee arthroscopy  1976  . Right knee replacement      FAMILY HISTORY: family history includes Heart disease in her father; Hypertension in her brother, brother, and sister; Kidney disease in her father; Kidney failure in her brother and sister.  SOCIAL HISTORY:  reports that she has quit smoking. She has never used smokeless tobacco. She reports that she does not drink alcohol or use illicit drugs.  REVIEW OF SYSTEMS:  Other than that discussed above is noncontributory.  PHYSICAL EXAMINATION: ECOG PERFORMANCE STATUS: 1 - Symptomatic but completely ambulatory  Blood pressure 171/78, pulse 65, temperature 97.9 F (36.6 C), temperature source Oral, resp. rate 20, weight 218 lb 4.8 oz (99.02 kg), SpO2 95.00%.  GENERAL:alert, no distress and  comfortable SKIN: skin color, texture, turgor are normal, no rashes or significant lesions  EYES: PERLA; Conjunctiva are pink and non-injected, sclera clear OROPHARYNX:no exudate, no erythema on lips, buccal mucosa, or tongue. NECK: supple, thyroid normal size, non-tender, without nodularity. No masses CHEST: Normal AP diameter with no breast masses. LYMPH:  no palpable lymphadenopathy in the cervical, axillary or inguinal LUNGS: clear to auscultation and percussion with normal breathing effort HEART: regular rate & rhythm and no murmurs. ABDOMEN:abdomen soft, non-tender and normal bowel sounds MUSCULOSKELETAL:no cyanosis of digits and no clubbing. Range of motion normal.  NEURO: alert & oriented x 3 with fluent speech, no focal motor/sensory deficits   LABORATORY DATA: Office Visit on 05/27/2013  Component Date Value Ref Range Status  . WBC 05/27/2013 10.1  4.0 - 10.5 K/uL Final  . RBC 05/27/2013 3.82* 3.87 - 5.11 MIL/uL Final  . Hemoglobin 05/27/2013 9.7* 12.0 - 15.0 g/dL Final  . HCT 19/14/782903/08/2013 30.0* 36.0 - 46.0 % Final  . MCV 05/27/2013 78.5  78.0 - 100.0 fL Final  . MCH 05/27/2013 25.4* 26.0 - 34.0 pg Final  . MCHC 05/27/2013 32.3  30.0 - 36.0 g/dL Final  . RDW 56/21/308603/08/2013 19.8* 11.5 - 15.5 % Final  . Platelets 05/27/2013 242  150 - 400 K/uL Final  . Neutrophils Relative % 05/27/2013 71  43 - 77 % Final  . Neutro Abs 05/27/2013 7.2  1.7 - 7.7 K/uL Final  . Lymphocytes Relative 05/27/2013 17  12 - 46 % Final  . Lymphs Abs 05/27/2013 1.7  0.7 - 4.0 K/uL Final  . Monocytes Relative 05/27/2013 7  3 - 12 % Final  . Monocytes Absolute 05/27/2013 0.8  0.1 - 1.0 K/uL Final  . Eosinophils Relative 05/27/2013 4  0 - 5 % Final  . Eosinophils Absolute 05/27/2013 0.4  0.0 - 0.7 K/uL Final  . Basophils Relative 05/27/2013 1  0 - 1 % Final  . Basophils Absolute 05/27/2013 0.1  0.0 - 0.1 K/uL Final  Appointment on 05/20/2013  Component Date Value Ref Range Status  . WBC 05/20/2013 9.2  4.0 -  10.5 K/uL Final  . RBC 05/20/2013 3.99  3.87 - 5.11 MIL/uL Final  . Hemoglobin 05/20/2013 9.7* 12.0 - 15.0 g/dL Final  . HCT 57/84/696202/27/2015 31.2* 36.0 - 46.0 % Final  . MCV 05/20/2013 78.2  78.0 - 100.0 fL Final  . MCH 05/20/2013 24.3* 26.0 - 34.0 pg Final  . MCHC 05/20/2013 31.1  30.0 - 36.0 g/dL Final  . RDW 95/28/413202/27/2015 19.6* 11.5 - 15.5 % Final  . Platelets 05/20/2013 228  150 - 400 K/uL Final  Infusion on 04/29/2013  Component Date Value Ref Range Status  . WBC 04/29/2013 10.7* 4.0 - 10.5 K/uL Final  . RBC 04/29/2013 4.37  3.87 - 5.11 MIL/uL Final  . Hemoglobin 04/29/2013 10.5* 12.0 - 15.0 g/dL Final  . HCT 44/01/027202/08/2013 34.7* 36.0 - 46.0 % Final  . MCV 04/29/2013 79.4  78.0 - 100.0 fL Final  . MCH 04/29/2013 24.0* 26.0 - 34.0 pg Final  . MCHC 04/29/2013 30.3  30.0 - 36.0 g/dL Final  . RDW 53/66/440302/08/2013 20.7* 11.5 - 15.5 % Final  . Platelets 04/29/2013 322  150 - 400 K/uL Final    PATHOLOGY: Peripheral smear failed to reveal evidence of premature forms.  Urinalysis    Component Value Date/Time   COLORURINE YELLOW 09/27/2012 1238   APPEARANCEUR CLEAR 09/27/2012 1238   LABSPEC 1.020 09/27/2012 1238   PHURINE 7.0 09/27/2012 1238   GLUCOSEU NEGATIVE 09/27/2012 1238   HGBUR SMALL* 09/27/2012 1238   BILIRUBINUR  NEGATIVE 09/27/2012 1238   KETONESUR NEGATIVE 09/27/2012 1238   PROTEINUR 100* 09/27/2012 1238   UROBILINOGEN 0.2 09/27/2012 1238   NITRITE NEGATIVE 09/27/2012 1238   LEUKOCYTESUR NEGATIVE 09/27/2012 1238    RADIOGRAPHIC STUDIES: No results found.  ASSESSMENT:  #1.Anemia of chronic disease in the setting of stage IV renal disease, refusing dialysis, stable hemoglobin.  #2. End stage renal disease, stage IV with creatinine over 7.  #3. Obstructive sleep apnea syndrome, on CPAP.  #4. Diabetes mellitus, type II, controlled.  #5. Diastolic cardiac dysfunction   PLAN:  #1. Procrit 40,000 units subcutaneously to be repeated in 3 weeks if hemoglobin is less than 11. #2. Followup in 6 weeks with CBC  and ferritin.   All questions were answered. The patient knows to call the clinic with any problems, questions or concerns. We can certainly see the patient much sooner if necessary.   I spent 25 minutes counseling the patient face to face. The total time spent in the appointment was 30 minutes.    Maurilio Lovely, MD 05/27/2013 3:33 PM

## 2013-05-27 NOTE — Progress Notes (Signed)
Terri Kelly presents today for injection per MD orders. Procrit 40,000 administered SQ in left Abdomen. Administration without incident. Patient tolerated well.

## 2013-05-28 LAB — FERRITIN: Ferritin: 131 ng/mL (ref 10–291)

## 2013-06-06 ENCOUNTER — Other Ambulatory Visit: Payer: Self-pay

## 2013-06-06 MED ORDER — HYDROCODONE-ACETAMINOPHEN 7.5-325 MG PO TABS: ORAL_TABLET | ORAL | Status: DC

## 2013-06-17 ENCOUNTER — Other Ambulatory Visit (HOSPITAL_COMMUNITY): Payer: Medicare Other

## 2013-06-17 ENCOUNTER — Other Ambulatory Visit: Payer: Self-pay

## 2013-06-17 ENCOUNTER — Ambulatory Visit (HOSPITAL_COMMUNITY): Payer: Medicare Other

## 2013-06-17 MED ORDER — HYDROCODONE-ACETAMINOPHEN 7.5-325 MG PO TABS: ORAL_TABLET | ORAL | Status: DC

## 2013-06-28 ENCOUNTER — Telehealth: Payer: Self-pay | Admitting: Family Medicine

## 2013-06-28 MED ORDER — PENICILLIN V POTASSIUM 500 MG PO TABS: 500.0000 mg | ORAL_TABLET | Freq: Three times a day (TID) | ORAL | Status: DC

## 2013-06-28 NOTE — Telephone Encounter (Signed)
pls erx pen v 500mg  one 3 times daily and let her know

## 2013-06-29 NOTE — Telephone Encounter (Signed)
Patient aware that abt sent to pharmacy

## 2013-07-08 ENCOUNTER — Ambulatory Visit (HOSPITAL_COMMUNITY): Payer: Medicare Other

## 2013-07-08 ENCOUNTER — Other Ambulatory Visit (HOSPITAL_COMMUNITY): Payer: Medicare Other

## 2013-07-09 NOTE — Progress Notes (Signed)
This encounter was created in error - please disregard.

## 2013-07-13 ENCOUNTER — Encounter (HOSPITAL_COMMUNITY): Payer: Medicare Other | Attending: Hematology and Oncology

## 2013-07-13 ENCOUNTER — Ambulatory Visit: Payer: Medicare Other | Admitting: Family Medicine

## 2013-07-13 ENCOUNTER — Encounter (HOSPITAL_COMMUNITY): Payer: Self-pay

## 2013-07-13 ENCOUNTER — Encounter (HOSPITAL_BASED_OUTPATIENT_CLINIC_OR_DEPARTMENT_OTHER): Payer: Medicare Other

## 2013-07-13 VITALS — BP 173/68 | HR 76 | Temp 97.5°F | Resp 18

## 2013-07-13 DIAGNOSIS — N189 Chronic kidney disease, unspecified: Secondary | ICD-10-CM | POA: Insufficient documentation

## 2013-07-13 DIAGNOSIS — D649 Anemia, unspecified: Secondary | ICD-10-CM

## 2013-07-13 DIAGNOSIS — Z5320 Procedure and treatment not carried out because of patient's decision for unspecified reasons: Secondary | ICD-10-CM

## 2013-07-13 DIAGNOSIS — D631 Anemia in chronic kidney disease: Secondary | ICD-10-CM | POA: Insufficient documentation

## 2013-07-13 DIAGNOSIS — R946 Abnormal results of thyroid function studies: Secondary | ICD-10-CM

## 2013-07-13 DIAGNOSIS — N2581 Secondary hyperparathyroidism of renal origin: Secondary | ICD-10-CM | POA: Insufficient documentation

## 2013-07-13 DIAGNOSIS — N039 Chronic nephritic syndrome with unspecified morphologic changes: Principal | ICD-10-CM

## 2013-07-13 DIAGNOSIS — N184 Chronic kidney disease, stage 4 (severe): Secondary | ICD-10-CM

## 2013-07-13 DIAGNOSIS — N186 End stage renal disease: Secondary | ICD-10-CM

## 2013-07-13 LAB — CBC WITH DIFFERENTIAL/PLATELET
Basophils Absolute: 0.1 10*3/uL (ref 0.0–0.1)
Basophils Relative: 0 % (ref 0–1)
EOS ABS: 0.3 10*3/uL (ref 0.0–0.7)
Eosinophils Relative: 3 % (ref 0–5)
HCT: 26 % — ABNORMAL LOW (ref 36.0–46.0)
Hemoglobin: 8.2 g/dL — ABNORMAL LOW (ref 12.0–15.0)
LYMPHS PCT: 16 % (ref 12–46)
Lymphs Abs: 1.9 10*3/uL (ref 0.7–4.0)
MCH: 24.5 pg — ABNORMAL LOW (ref 26.0–34.0)
MCHC: 31.5 g/dL (ref 30.0–36.0)
MCV: 77.6 fL — ABNORMAL LOW (ref 78.0–100.0)
Monocytes Absolute: 0.8 10*3/uL (ref 0.1–1.0)
Monocytes Relative: 6 % (ref 3–12)
NEUTROS ABS: 9.2 10*3/uL — AB (ref 1.7–7.7)
Neutrophils Relative %: 75 % (ref 43–77)
PLATELETS: 230 10*3/uL (ref 150–400)
RBC: 3.35 MIL/uL — AB (ref 3.87–5.11)
RDW: 18.7 % — ABNORMAL HIGH (ref 11.5–15.5)
WBC: 12.3 10*3/uL — AB (ref 4.0–10.5)

## 2013-07-13 MED ORDER — EPOETIN ALFA 40000 UNIT/ML IJ SOLN
40000.0000 [IU] | Freq: Once | INTRAMUSCULAR | Status: AC
  Administered 2013-07-13: 40000 [IU] via SUBCUTANEOUS

## 2013-07-13 MED ORDER — EPOETIN ALFA 40000 UNIT/ML IJ SOLN
INTRAMUSCULAR | Status: AC
  Filled 2013-07-13: qty 1

## 2013-07-13 NOTE — Progress Notes (Signed)
Terri Kelly presents today for injection per MD orders. Procrit 40,000units administered SQ in right Abdomen. Administration without incident. Patient tolerated well.

## 2013-07-13 NOTE — Progress Notes (Signed)
See MD exam notes 

## 2013-07-13 NOTE — Progress Notes (Signed)
Labs drawn today for cbc,ferr,thyroid cascade profile

## 2013-07-13 NOTE — Patient Instructions (Signed)
[facility] Discharge Instructions  RECOMMENDATIONS MADE BY THE CONSULTANT AND ANY TEST RESULTS WILL BE SENT TO YOUR REFERRING PHYSICIAN.  We will see you in 3 weeks for lab work and possible Procrit injection. We will see you again 3 weeks after that visit for labs, possible injection and a doctor's visit.  Thank you for choosing [facility] to provide your oncology and hematology care.  To afford each patient quality time with our providers, please arrive at least 15 minutes before your scheduled appointment time.  With your help, our goal is to use those 15 minutes to complete the necessary work-up to ensure our physicians have the information they need to help with your evaluation and healthcare recommendations.    Effective January 1st, 2014, we ask that you re-schedule your appointment with our physicians should you arrive 10 or more minutes late for your appointment.  We strive to give you quality time with our providers, and arriving late affects you and other patients whose appointments are after yours.    Again, thank you for choosing [facility].  Our hope is that these requests will decrease the amount of time that you wait before being seen by our physicians.       _____________________________________________________________  Should you have questions after your visit to [facility], please contact our office at [phone] between the hours of 8:30 a.m. and 5:00 p.m.  Voicemails left after 4:30 p.m. will not be returned until the following business day.  For prescription refill requests, have your pharmacy contact our office with your prescription refill request.

## 2013-07-13 NOTE — Progress Notes (Signed)
[facility]  OFFICE PROGRESS NOTE  Terri Overman, MD [address] [place] [address]  DIAGNOSIS: Anemia in chronic kidney disease  ESRD (end stage renal disease)  Hyperparathyroidism, secondary renal  Chief Complaint  Patient presents with  . Anemia secondary to kidney disease  . Stage IV kidney disease    CURRENT THERAPY: Procrit subcutaneously, last treatment on 05/27/2013 with intent to repeat on 06/17/2013.   INTERVAL HISTORY: Terri Kelly 78 y.o. female returns for followup of anemia secondary to stage IV chronic kidney disease, patient refusing dialysis, while receiving Procrit intermittently to maintain hemoglobin nearly 11.  Apparently she received appointment 3 weeks ago for additional Procrit. She feels well except for shooting pain down both lower extremities occurring at rest. She spends most of the day watching television. She is making urine. She denies any incontinence, fever, night sweats, abdominal pain, craving for ice, cough, wheezing, PND, orthopnea, palpitations, skin rash, headache, or seizures. Terri Kelly denies any melena, hematochezia, hematuria, vaginal bleeding, epistaxis, or hemoptysis.  MEDICAL HISTORY: Past Medical History  Diagnosis Date  . Hyperlipidemia   . Obesity   . Hypertension   . Diabetes mellitus type 1   . Back pain   . Sleep apnea 2009    non compliant with c-pap  . Diastolic dysfunction Nov 2011    Hospitalised for 3 days at [facility]   . Normocytic anemia 04/09/2012  . Allergy   . Arthritis   . Chronic kidney disease     does not want HD    INTERIM HISTORY: has IGT (impaired glucose tolerance); HYPERLIPIDEMIA; OBESITY; HYPERTENSION; ESRD (end stage renal disease); HIP PAIN, LEFT; Osteoarthritis of both knees; BACK PAIN; DISORDER OF BONE AND CARTILAGE UNSPECIFIED; FATIGUE; ABNORMAL THYROID FUNCTION TESTS; Hyperparathyroidism, secondary renal; Normocytic anemia; Neck muscle  spasm; Grief; Hypocalcemia; Hypomagnesemia; Prolonged Q-T interval on ECG; Acute on chronic diastolic CHF (congestive heart failure); Anemia in chronic kidney disease; GERD (gastroesophageal reflux disease); OA (osteoarthritis) of knee; Knee pain; Anemia in chronic kidney disease(285.21); and Insomnia on her problem list.    ALLERGIES:  is allergic to ace inhibitors; angiotensin receptor blockers; and other.  MEDICATIONS: has a current medication list which includes the following prescription(s): amlodipine, aspirin, calcitriol, calcium-vitamin d, diphenhydramine, diphenhydramine, folic acid, furosemide, hydralazine, hydrocodone-acetaminophen, labetalol, levothyroxine, lovastatin, omeprazole, penicillin v potassium, sodium bicarbonate, temazepam, and thiamine hcl, and the following Facility-Administered Medications: epoetin alfa.  SURGICAL HISTORY:  Past Surgical History  Procedure Laterality Date  . Vaginal hysterectomy  1978  . Myomectomy  1978  . Right knee arthroscopy  1976  . Right knee replacement      FAMILY HISTORY: family history includes Heart disease in her father; Hypertension in her brother, brother, and sister; Kidney disease in her father; Kidney failure in her brother and sister.  SOCIAL HISTORY:  reports that she has quit smoking. She has never used smokeless tobacco. She reports that she does not drink alcohol or use illicit drugs.  REVIEW OF SYSTEMS:  Other than that discussed above is noncontributory.  PHYSICAL EXAMINATION: ECOG PERFORMANCE STATUS: 2 - Symptomatic, <50% confined to bed  Blood pressure 173/68, pulse 76, temperature 97.5 F (36.4 C), temperature source Oral, resp. rate 18, SpO2 98.00%.  GENERAL:alert, no distress and comfortable. Morbidly obese. SKIN: skin color, texture, turgor are normal, no rashes or significant lesions EYES: PERLA; Conjunctiva are pink and non-injected, sclera clear SINUSES: No redness or tenderness over maxillary or ethmoid  sinuses OROPHARYNX:no exudate, no erythema on lips, buccal mucosa, or tongue. NECK: supple, thyroid normal size, non-tender, without nodularity. No masses CHEST: Normal AP diameter with no breast masses. LYMPH:  no palpable lymphadenopathy in the cervical, axillary or inguinal LUNGS: clear to auscultation and percussion with normal breathing effort HEART: regular rate & rhythm and no murmurs. ABDOMEN:abdomen soft, non-tender and normal bowel sounds MUSCULOSKELETAL:no cyanosis of digits and no clubbing. Range of motion normal. Bilateral knee swelling without effusion. Decreased range of motion in both knees. NEURO: alert & oriented x 3 with fluent speech, no focal motor/sensory deficits. No evidence of hyperreflexia.   LABORATORY DATA: Infusion on 07/13/2013  Component Date Value Ref Range Status  . WBC 07/13/2013 12.3* 4.0 - 10.5 K/uL Final  . RBC 07/13/2013 3.35* 3.87 - 5.11 MIL/uL Final  . Hemoglobin 07/13/2013 8.2* 12.0 - 15.0 g/dL Final  . HCT 16/10/960404/22/2015 26.0* 36.0 - 46.0 % Final  . MCV 07/13/2013 77.6* 78.0 - 100.0 fL Final  . MCH 07/13/2013 24.5* 26.0 - 34.0 pg Final  . MCHC 07/13/2013 31.5  30.0 - 36.0 g/dL Final  . RDW 54/09/811904/22/2015 18.7* 11.5 - 15.5 % Final  . Platelets 07/13/2013 230  150 - 400 K/uL Final  . Neutrophils Relative % 07/13/2013 75  43 - 77 % Final  . Neutro Abs 07/13/2013 9.2* 1.7 - 7.7 K/uL Final  . Lymphocytes Relative 07/13/2013 16  12 - 46 % Final  . Lymphs Abs 07/13/2013 1.9  0.7 - 4.0 K/uL Final  . Monocytes Relative 07/13/2013 6  3 - 12 % Final  . Monocytes Absolute 07/13/2013 0.8  0.1 - 1.0 K/uL Final  . Eosinophils Relative 07/13/2013 3  0 - 5 % Final  . Eosinophils Absolute 07/13/2013 0.3  0.0 - 0.7 K/uL Final  . Basophils Relative 07/13/2013 0  0 - 1 % Final  . Basophils Absolute 07/13/2013 0.1  0.0 - 0.1 K/uL Final    PATHOLOGY: No new pathology.  Urinalysis    Component Value Date/Time   COLORURINE YELLOW 09/27/2012 1238   APPEARANCEUR CLEAR  09/27/2012 1238   LABSPEC 1.020 09/27/2012 1238   PHURINE 7.0 09/27/2012 1238   GLUCOSEU NEGATIVE 09/27/2012 1238   HGBUR SMALL* 09/27/2012 1238   BILIRUBINUR NEGATIVE 09/27/2012 1238   KETONESUR NEGATIVE 09/27/2012 1238   PROTEINUR 100* 09/27/2012 1238   UROBILINOGEN 0.2 09/27/2012 1238   NITRITE NEGATIVE 09/27/2012 1238   LEUKOCYTESUR NEGATIVE 09/27/2012 1238    RADIOGRAPHIC STUDIES: No results found.  ASSESSMENT:  #1.Anemia of chronic disease in the setting of stage IV renal disease, refusing dialysis, stable hemoglobin, for Procrit today. #2. End stage renal disease, stage IV with creatinine over 7, refusing dialysis. #3. Obstructive sleep apnea syndrome, on CPAP.  #4. Diabetes mellitus, type II, controlled.  #5. Diastolic cardiac dysfunction #6. Probable spinal stenosis.       PLAN:  #1. Procrit 40,000 units subcutaneous he today to be repeated in 3 weeks if hemoglobin is less than 11. #2. Discussion was held regarding workup for spinal stenosis which would include MRI of the lumbar spine. The patient is not willing to undergo this intervention at this time. #3. Followup in 6 weeks with CBC and ferritin.   All questions were answered. The patient knows to call the clinic with any problems, questions or concerns. We can certainly see the patient much sooner if necessary.   I spent 25 minutes counseling the patient face to face. The total time spent in the appointment was 30  minutes.    Alla GermanGregory Doni Widmer, MD 07/13/2013 1:11 PM  DISCLAIMER:  This note was dictated with voice recognition software.  Similar sounding words can inadvertently be transcribed inaccurately and may not be corrected upon review.

## 2013-07-14 LAB — FERRITIN: Ferritin: 388 ng/mL — ABNORMAL HIGH (ref 10–291)

## 2013-07-26 ENCOUNTER — Encounter: Payer: Self-pay | Admitting: Family Medicine

## 2013-07-26 ENCOUNTER — Ambulatory Visit (INDEPENDENT_AMBULATORY_CARE_PROVIDER_SITE_OTHER): Payer: Medicare Other | Admitting: Family Medicine

## 2013-07-26 ENCOUNTER — Encounter (INDEPENDENT_AMBULATORY_CARE_PROVIDER_SITE_OTHER): Payer: Self-pay

## 2013-07-26 VITALS — BP 172/84 | HR 60 | Temp 97.9°F | Resp 18 | Ht 60.0 in

## 2013-07-26 DIAGNOSIS — R5381 Other malaise: Secondary | ICD-10-CM

## 2013-07-26 DIAGNOSIS — R058 Other specified cough: Secondary | ICD-10-CM

## 2013-07-26 DIAGNOSIS — R5383 Other fatigue: Secondary | ICD-10-CM

## 2013-07-26 DIAGNOSIS — J3089 Other allergic rhinitis: Secondary | ICD-10-CM

## 2013-07-26 DIAGNOSIS — J309 Allergic rhinitis, unspecified: Secondary | ICD-10-CM

## 2013-07-26 DIAGNOSIS — N186 End stage renal disease: Secondary | ICD-10-CM

## 2013-07-26 DIAGNOSIS — E785 Hyperlipidemia, unspecified: Secondary | ICD-10-CM

## 2013-07-26 DIAGNOSIS — M25569 Pain in unspecified knee: Secondary | ICD-10-CM

## 2013-07-26 DIAGNOSIS — M25562 Pain in left knee: Secondary | ICD-10-CM

## 2013-07-26 DIAGNOSIS — F329 Major depressive disorder, single episode, unspecified: Secondary | ICD-10-CM

## 2013-07-26 DIAGNOSIS — F32A Depression, unspecified: Secondary | ICD-10-CM

## 2013-07-26 DIAGNOSIS — M899 Disorder of bone, unspecified: Secondary | ICD-10-CM

## 2013-07-26 DIAGNOSIS — R05 Cough: Secondary | ICD-10-CM

## 2013-07-26 DIAGNOSIS — F3289 Other specified depressive episodes: Secondary | ICD-10-CM

## 2013-07-26 DIAGNOSIS — I10 Essential (primary) hypertension: Secondary | ICD-10-CM

## 2013-07-26 DIAGNOSIS — M949 Disorder of cartilage, unspecified: Secondary | ICD-10-CM

## 2013-07-26 DIAGNOSIS — J302 Other seasonal allergic rhinitis: Secondary | ICD-10-CM

## 2013-07-26 DIAGNOSIS — R059 Cough, unspecified: Secondary | ICD-10-CM

## 2013-07-26 MED ORDER — PROMETHAZINE-DM 6.25-15 MG/5ML PO SYRP: ORAL_SOLUTION | ORAL | Status: DC

## 2013-07-26 MED ORDER — PREDNISONE 5 MG PO TABS: 5.0000 mg | ORAL_TABLET | Freq: Two times a day (BID) | ORAL | Status: AC

## 2013-07-26 MED ORDER — FLUTICASONE PROPIONATE 50 MCG/ACT NA SUSP: 2.0000 | Freq: Every day | NASAL | Status: DC

## 2013-07-26 MED ORDER — MONTELUKAST SODIUM 10 MG PO TABS: 10.0000 mg | ORAL_TABLET | Freq: Every day | ORAL | Status: DC

## 2013-07-26 MED ORDER — METHYLPREDNISOLONE ACETATE 80 MG/ML IJ SUSP
80.0000 mg | Freq: Once | INTRAMUSCULAR | Status: AC
  Administered 2013-07-26: 80 mg via INTRAMUSCULAR

## 2013-07-26 MED ORDER — KETOROLAC TROMETHAMINE 60 MG/2ML IM SOLN
60.0000 mg | Freq: Once | INTRAMUSCULAR | Status: AC
  Administered 2013-07-26: 60 mg via INTRAMUSCULAR

## 2013-07-26 NOTE — Patient Instructions (Addendum)
F/u in 4 months call if you need me before  You will get lab order to be drawn next week when you see your kidney specialist. TSH, hepatic , vit D, non fasting  You are  Treated for uncontrolled allergies and left knee pain,   Medication as discussed

## 2013-07-29 ENCOUNTER — Other Ambulatory Visit: Payer: Self-pay

## 2013-07-29 MED ORDER — HYDROCODONE-ACETAMINOPHEN 7.5-325 MG PO TABS: ORAL_TABLET | ORAL | Status: DC

## 2013-08-03 ENCOUNTER — Other Ambulatory Visit (HOSPITAL_COMMUNITY): Payer: Medicare Other

## 2013-08-03 ENCOUNTER — Ambulatory Visit (HOSPITAL_COMMUNITY): Payer: Medicare Other

## 2013-08-05 ENCOUNTER — Encounter (HOSPITAL_COMMUNITY): Payer: Medicare Other | Attending: Hematology and Oncology

## 2013-08-05 ENCOUNTER — Encounter (HOSPITAL_COMMUNITY): Payer: Medicare Other

## 2013-08-05 DIAGNOSIS — N2581 Secondary hyperparathyroidism of renal origin: Secondary | ICD-10-CM | POA: Insufficient documentation

## 2013-08-05 DIAGNOSIS — N189 Chronic kidney disease, unspecified: Principal | ICD-10-CM

## 2013-08-05 DIAGNOSIS — N186 End stage renal disease: Secondary | ICD-10-CM | POA: Insufficient documentation

## 2013-08-05 DIAGNOSIS — D631 Anemia in chronic kidney disease: Secondary | ICD-10-CM | POA: Insufficient documentation

## 2013-08-05 DIAGNOSIS — N039 Chronic nephritic syndrome with unspecified morphologic changes: Principal | ICD-10-CM

## 2013-08-05 DIAGNOSIS — D649 Anemia, unspecified: Secondary | ICD-10-CM | POA: Insufficient documentation

## 2013-08-05 DIAGNOSIS — I10 Essential (primary) hypertension: Secondary | ICD-10-CM

## 2013-08-05 LAB — CBC WITH DIFFERENTIAL/PLATELET
BASOS ABS: 0.1 10*3/uL (ref 0.0–0.1)
BASOS PCT: 0 % (ref 0–1)
Eosinophils Absolute: 0.5 10*3/uL (ref 0.0–0.7)
Eosinophils Relative: 4 % (ref 0–5)
HCT: 21.7 % — ABNORMAL LOW (ref 36.0–46.0)
Hemoglobin: 6.9 g/dL — CL (ref 12.0–15.0)
Lymphocytes Relative: 12 % (ref 12–46)
Lymphs Abs: 1.8 10*3/uL (ref 0.7–4.0)
MCH: 24 pg — ABNORMAL LOW (ref 26.0–34.0)
MCHC: 31.8 g/dL (ref 30.0–36.0)
MCV: 75.6 fL — ABNORMAL LOW (ref 78.0–100.0)
Monocytes Absolute: 1 10*3/uL (ref 0.1–1.0)
Monocytes Relative: 7 % (ref 3–12)
NEUTROS ABS: 11.3 10*3/uL — AB (ref 1.7–7.7)
Neutrophils Relative %: 77 % (ref 43–77)
PLATELETS: 201 10*3/uL (ref 150–400)
RBC: 2.87 MIL/uL — ABNORMAL LOW (ref 3.87–5.11)
RDW: 19.7 % — AB (ref 11.5–15.5)
WBC: 14.6 10*3/uL — ABNORMAL HIGH (ref 4.0–10.5)

## 2013-08-05 LAB — FERRITIN: Ferritin: 436 ng/mL — ABNORMAL HIGH (ref 10–291)

## 2013-08-05 NOTE — Addendum Note (Signed)
Addended by: Vergia AlconANDERSON, Lyndzie Zentz W on: 08/05/2013 02:43 PM   Modules accepted: Orders

## 2013-08-05 NOTE — Progress Notes (Signed)
Terri Kelly presented for labwork. Labs per MD order drawn via Peripheral Line 23 gauge needle inserted in right antecubital.  Good blood return present. Procedure without incident.  Needle removed intact. Patient tolerated procedure well.

## 2013-08-05 NOTE — Progress Notes (Signed)
CRITICAL VALUE ALERT  Critical value received:  Hgb 6.9  Date of notification:  08/05/13  Time of notification:  1422  Critical value read back:yes  Nurse who received alert:  A. Dareen PianoAnderson, RN  MD notified (1st page):  Dr. Zigmund DanielFormanek  Time of first page:  1423  Per Dr. Zigmund DanielFormanek, obtain ferritin level today; if level is low, we will administer feraheme early next week.   Vaanya C Lein presented for labwork. Labs per MD order drawn via Peripheral Line 23 gauge needle inserted in right antecubital.  Good blood return present. Procedure without incident.  Needle removed intact. Patient tolerated procedure well.

## 2013-08-08 ENCOUNTER — Telehealth: Payer: Self-pay

## 2013-08-08 NOTE — Telephone Encounter (Signed)
Last labs sent to Dr Hyman HopesWebb

## 2013-08-09 ENCOUNTER — Telehealth (HOSPITAL_COMMUNITY): Payer: Self-pay

## 2013-08-09 ENCOUNTER — Other Ambulatory Visit (HOSPITAL_COMMUNITY): Payer: Self-pay | Admitting: Hematology and Oncology

## 2013-08-09 NOTE — Telephone Encounter (Signed)
Spoke with daughter.  Stated that Terri Kelly is not more symptomatic and she is not able to get her in today for procrit but will try to get her here tomorrow at 1 pm.

## 2013-08-09 NOTE — Telephone Encounter (Signed)
Message copied by Evelena LeydenBURGESS, Laraine Samet S on Tue Aug 09, 2013  9:48 AM ------      Message from: Alla GermanFORMANEK, GREGORY A      Created: Tue Aug 09, 2013  9:36 AM       Continue Procrit and transfuse if becomes more symptomatic.  ------

## 2013-08-09 NOTE — Telephone Encounter (Signed)
Call from daughter, Ailene ArdsOmega wanting to know plan regarding lab results from 08/05/13?  Can be reached at [phone].  Next appointment not until 08/26/13.

## 2013-08-10 ENCOUNTER — Encounter (HOSPITAL_BASED_OUTPATIENT_CLINIC_OR_DEPARTMENT_OTHER): Payer: Medicare Other

## 2013-08-10 VITALS — BP 169/67 | HR 66 | Temp 97.9°F | Resp 20

## 2013-08-10 DIAGNOSIS — N184 Chronic kidney disease, stage 4 (severe): Secondary | ICD-10-CM

## 2013-08-10 DIAGNOSIS — D649 Anemia, unspecified: Secondary | ICD-10-CM

## 2013-08-10 DIAGNOSIS — N186 End stage renal disease: Secondary | ICD-10-CM

## 2013-08-10 DIAGNOSIS — N039 Chronic nephritic syndrome with unspecified morphologic changes: Secondary | ICD-10-CM

## 2013-08-10 DIAGNOSIS — D631 Anemia in chronic kidney disease: Secondary | ICD-10-CM

## 2013-08-10 MED ORDER — EPOETIN ALFA 40000 UNIT/ML IJ SOLN
INTRAMUSCULAR | Status: AC
  Filled 2013-08-10: qty 1

## 2013-08-10 MED ORDER — EPOETIN ALFA 40000 UNIT/ML IJ SOLN
40000.0000 [IU] | Freq: Once | INTRAMUSCULAR | Status: AC
  Administered 2013-08-10: 40000 [IU] via SUBCUTANEOUS

## 2013-08-10 NOTE — Progress Notes (Signed)
Terri Kelly presents today for injection per MD orders. Procrit [id number] units administered SQ in right Abdomen. Administration without incident. Patient tolerated well.

## 2013-08-11 LAB — MISCELLANEOUS TEST: Miscellaneous Test: 55270

## 2013-08-12 ENCOUNTER — Telehealth: Payer: Self-pay | Admitting: Family Medicine

## 2013-08-12 NOTE — Telephone Encounter (Signed)
Pls contact pt and caregiver. Recent labwork from her nephrologist, shows that the thyroid is undercorrected. Needs to take thyroid med every morning on EMPTY stomach with no other meds. Wait 30 to 60 mins before eating If ahe has been taking the dose daily as prescribed, pls send in new script for daily#30 refill 3. She will need rept TSh, also fasting l ipid and hepatin in 3rd wek in July pls order and let her know also  ?? pls ask.  Pls d/c the dose if she will start the 75 mcg daily dose. nOTe if she has not been taking any med,needs the daily set instead

## 2013-08-17 ENCOUNTER — Ambulatory Visit (INDEPENDENT_AMBULATORY_CARE_PROVIDER_SITE_OTHER): Payer: Medicare Other | Admitting: Family Medicine

## 2013-08-17 ENCOUNTER — Encounter: Payer: Self-pay | Admitting: Family Medicine

## 2013-08-17 ENCOUNTER — Ambulatory Visit: Payer: Medicare Other | Admitting: Family Medicine

## 2013-08-17 VITALS — BP 154/84 | HR 62 | Resp 20 | Ht 60.0 in

## 2013-08-17 DIAGNOSIS — N186 End stage renal disease: Secondary | ICD-10-CM

## 2013-08-17 DIAGNOSIS — I10 Essential (primary) hypertension: Secondary | ICD-10-CM

## 2013-08-17 DIAGNOSIS — J309 Allergic rhinitis, unspecified: Secondary | ICD-10-CM

## 2013-08-17 DIAGNOSIS — N3090 Cystitis, unspecified without hematuria: Secondary | ICD-10-CM

## 2013-08-17 DIAGNOSIS — E785 Hyperlipidemia, unspecified: Secondary | ICD-10-CM

## 2013-08-17 DIAGNOSIS — F329 Major depressive disorder, single episode, unspecified: Secondary | ICD-10-CM

## 2013-08-17 DIAGNOSIS — K219 Gastro-esophageal reflux disease without esophagitis: Secondary | ICD-10-CM

## 2013-08-17 DIAGNOSIS — F4321 Adjustment disorder with depressed mood: Secondary | ICD-10-CM

## 2013-08-17 DIAGNOSIS — M25569 Pain in unspecified knee: Secondary | ICD-10-CM

## 2013-08-17 DIAGNOSIS — F32A Depression, unspecified: Secondary | ICD-10-CM

## 2013-08-17 DIAGNOSIS — E039 Hypothyroidism, unspecified: Secondary | ICD-10-CM

## 2013-08-17 DIAGNOSIS — F3289 Other specified depressive episodes: Secondary | ICD-10-CM

## 2013-08-17 LAB — POCT URINALYSIS DIPSTICK
Bilirubin, UA: NEGATIVE
GLUCOSE UA: NEGATIVE
KETONES UA: NEGATIVE
Nitrite, UA: NEGATIVE
Spec Grav, UA: 1.02
Urobilinogen, UA: 0.2
pH, UA: 6

## 2013-08-17 MED ORDER — LEVOTHYROXINE SODIUM 75 MCG PO TABS: 75.0000 ug | ORAL_TABLET | Freq: Every day | ORAL | Status: AC

## 2013-08-17 MED ORDER — CIPROFLOXACIN HCL 500 MG PO TABS: 500.0000 mg | ORAL_TABLET | Freq: Two times a day (BID) | ORAL | Status: DC

## 2013-08-17 MED ORDER — NYSTATIN-TRIAMCINOLONE 100000-0.1 UNIT/GM-% EX OINT: TOPICAL_OINTMENT | CUTANEOUS | Status: DC

## 2013-08-17 MED ORDER — FLUOXETINE HCL 10 MG PO TABS: 10.0000 mg | ORAL_TABLET | Freq: Every day | ORAL | Status: DC

## 2013-08-17 NOTE — Patient Instructions (Addendum)
F/u in 8 to 10 weeks  Please start to verbalize your feelings and cry with your family, also you need to speak with your Renato Gails to get the help that you need, as far as expressing how you feeel and your grief  New medication to help with depressionm prozac 10mg  one daily  You appear to have a urinary tract infection, ciprofloxacin 500mg  twice daily is prescribed

## 2013-08-17 NOTE — Telephone Encounter (Signed)
Called and spoke with patient and she is not exactly sure of what dose of Synthroid that she is currently taking.  Has an appt for today and will bring medicine with her.

## 2013-08-18 ENCOUNTER — Other Ambulatory Visit: Payer: Self-pay

## 2013-08-18 MED ORDER — NYSTATIN 100000 UNIT/GM EX CREA: 1.0000 "application " | TOPICAL_CREAM | Freq: Two times a day (BID) | CUTANEOUS | Status: DC

## 2013-08-19 ENCOUNTER — Other Ambulatory Visit (HOSPITAL_COMMUNITY): Payer: Self-pay | Admitting: Hematology and Oncology

## 2013-08-19 ENCOUNTER — Telehealth (HOSPITAL_COMMUNITY): Payer: Self-pay | Admitting: *Deleted

## 2013-08-19 ENCOUNTER — Encounter (HOSPITAL_BASED_OUTPATIENT_CLINIC_OR_DEPARTMENT_OTHER): Payer: Medicare Other

## 2013-08-19 VITALS — BP 172/77 | HR 68

## 2013-08-19 DIAGNOSIS — N189 Chronic kidney disease, unspecified: Principal | ICD-10-CM

## 2013-08-19 DIAGNOSIS — N039 Chronic nephritic syndrome with unspecified morphologic changes: Secondary | ICD-10-CM

## 2013-08-19 DIAGNOSIS — N186 End stage renal disease: Secondary | ICD-10-CM

## 2013-08-19 DIAGNOSIS — D631 Anemia in chronic kidney disease: Secondary | ICD-10-CM

## 2013-08-19 DIAGNOSIS — N2581 Secondary hyperparathyroidism of renal origin: Secondary | ICD-10-CM

## 2013-08-19 DIAGNOSIS — D649 Anemia, unspecified: Secondary | ICD-10-CM

## 2013-08-19 LAB — CBC WITH DIFFERENTIAL/PLATELET
Basophils Absolute: 0 10*3/uL (ref 0.0–0.1)
Basophils Relative: 0 % (ref 0–1)
EOS ABS: 0.4 10*3/uL (ref 0.0–0.7)
EOS PCT: 4 % (ref 0–5)
HCT: 21.9 % — ABNORMAL LOW (ref 36.0–46.0)
Hemoglobin: 6.6 g/dL — CL (ref 12.0–15.0)
Lymphocytes Relative: 14 % (ref 12–46)
Lymphs Abs: 1.3 10*3/uL (ref 0.7–4.0)
MCH: 23.7 pg — AB (ref 26.0–34.0)
MCHC: 30.1 g/dL (ref 30.0–36.0)
MCV: 78.8 fL (ref 78.0–100.0)
Monocytes Absolute: 0.6 10*3/uL (ref 0.1–1.0)
Monocytes Relative: 6 % (ref 3–12)
Neutro Abs: 7.6 10*3/uL (ref 1.7–7.7)
Neutrophils Relative %: 77 % (ref 43–77)
PLATELETS: 221 10*3/uL (ref 150–400)
RBC: 2.78 MIL/uL — ABNORMAL LOW (ref 3.87–5.11)
RDW: 22.5 % — AB (ref 11.5–15.5)
WBC: 9.9 10*3/uL (ref 4.0–10.5)

## 2013-08-19 MED ORDER — EPOETIN ALFA 40000 UNIT/ML IJ SOLN
40000.0000 [IU] | Freq: Once | INTRAMUSCULAR | Status: AC
  Administered 2013-08-19: 40000 [IU] via SUBCUTANEOUS

## 2013-08-19 MED ORDER — EPOETIN ALFA 40000 UNIT/ML IJ SOLN
INTRAMUSCULAR | Status: AC
  Filled 2013-08-19: qty 1

## 2013-08-19 MED ORDER — EPOETIN ALFA 20000 UNIT/ML IJ SOLN
20000.0000 [IU] | Freq: Once | INTRAMUSCULAR | Status: AC
  Administered 2013-08-19: 20000 [IU] via SUBCUTANEOUS

## 2013-08-19 MED ORDER — EPOETIN ALFA 20000 UNIT/ML IJ SOLN
INTRAMUSCULAR | Status: AC
Start: 2013-08-19 — End: 2013-08-19
  Filled 2013-08-19: qty 1

## 2013-08-19 NOTE — Telephone Encounter (Signed)
.  CRITICAL VALUE ALERT Critical value received:  Hgb = 6.6 Date of notification:  08/19/2013 Time of notification: 1458 Critical value read back:  yes Nurse who received alert:  SB/TAR MD notified (1st page):  Dr. Zigmund Daniel notified, patient denies being anymore symptomatic, procrit dose increased.

## 2013-08-19 NOTE — Progress Notes (Signed)
Terri Kelly presents today for injection per MD orders. Procrit [id number] units administered SQ in left and right abdomen in divided doses. Administration without incident. Patient tolerated well.

## 2013-08-19 NOTE — Progress Notes (Signed)
Labs drawn

## 2013-08-19 NOTE — Progress Notes (Signed)
Terri Kelly presented for labwork. Labs per MD order drawn via Peripheral Line 25 gauge needle inserted in rt ac.  Good blood return present. Procedure without incident.  Needle removed intact. Patient tolerated procedure well.

## 2013-08-20 LAB — URINE CULTURE

## 2013-08-24 ENCOUNTER — Other Ambulatory Visit (HOSPITAL_COMMUNITY): Payer: Medicare Other

## 2013-08-24 ENCOUNTER — Ambulatory Visit (HOSPITAL_COMMUNITY): Payer: Medicare Other

## 2013-08-25 ENCOUNTER — Other Ambulatory Visit: Payer: Self-pay

## 2013-08-25 MED ORDER — HYDROCODONE-ACETAMINOPHEN 7.5-325 MG PO TABS: ORAL_TABLET | ORAL | Status: DC

## 2013-08-26 ENCOUNTER — Encounter (HOSPITAL_BASED_OUTPATIENT_CLINIC_OR_DEPARTMENT_OTHER): Payer: Medicare Other

## 2013-08-26 ENCOUNTER — Encounter (HOSPITAL_COMMUNITY): Payer: Medicare Other | Attending: Hematology and Oncology

## 2013-08-26 ENCOUNTER — Ambulatory Visit (HOSPITAL_COMMUNITY): Payer: Medicare Other | Admitting: Oncology

## 2013-08-26 VITALS — BP 180/69 | HR 71

## 2013-08-26 DIAGNOSIS — D631 Anemia in chronic kidney disease: Secondary | ICD-10-CM

## 2013-08-26 DIAGNOSIS — N039 Chronic nephritic syndrome with unspecified morphologic changes: Secondary | ICD-10-CM

## 2013-08-26 DIAGNOSIS — N189 Chronic kidney disease, unspecified: Principal | ICD-10-CM

## 2013-08-26 DIAGNOSIS — N184 Chronic kidney disease, stage 4 (severe): Secondary | ICD-10-CM

## 2013-08-26 DIAGNOSIS — N186 End stage renal disease: Secondary | ICD-10-CM

## 2013-08-26 DIAGNOSIS — D649 Anemia, unspecified: Secondary | ICD-10-CM | POA: Insufficient documentation

## 2013-08-26 LAB — CBC WITH DIFFERENTIAL/PLATELET
BASOS PCT: 0 % (ref 0–1)
Basophils Absolute: 0 10*3/uL (ref 0.0–0.1)
EOS PCT: 4 % (ref 0–5)
Eosinophils Absolute: 0.4 10*3/uL (ref 0.0–0.7)
HEMATOCRIT: 23.5 % — AB (ref 36.0–46.0)
HEMOGLOBIN: 7.2 g/dL — AB (ref 12.0–15.0)
LYMPHS PCT: 12 % (ref 12–46)
Lymphs Abs: 1.3 10*3/uL (ref 0.7–4.0)
MCH: 24.6 pg — ABNORMAL LOW (ref 26.0–34.0)
MCHC: 30.6 g/dL (ref 30.0–36.0)
MCV: 80.2 fL (ref 78.0–100.0)
MONOS PCT: 10 % (ref 3–12)
Monocytes Absolute: 1.1 10*3/uL — ABNORMAL HIGH (ref 0.1–1.0)
NEUTROS ABS: 8.4 10*3/uL — AB (ref 1.7–7.7)
Neutrophils Relative %: 74 % (ref 43–77)
Platelets: 280 10*3/uL (ref 150–400)
RBC: 2.93 MIL/uL — ABNORMAL LOW (ref 3.87–5.11)
RDW: 23.5 % — ABNORMAL HIGH (ref 11.5–15.5)
WBC: 11.2 10*3/uL — AB (ref 4.0–10.5)

## 2013-08-26 LAB — COMPREHENSIVE METABOLIC PANEL
ALK PHOS: 89 U/L (ref 39–117)
ALT: 9 U/L (ref 0–35)
AST: 11 U/L (ref 0–37)
Albumin: 4 g/dL (ref 3.5–5.2)
BUN: 101 mg/dL — ABNORMAL HIGH (ref 6–23)
CO2: 15 meq/L — AB (ref 19–32)
Calcium: 9 mg/dL (ref 8.4–10.5)
Chloride: 103 mEq/L (ref 96–112)
Creatinine, Ser: 9.15 mg/dL — ABNORMAL HIGH (ref 0.50–1.10)
GFR, EST AFRICAN AMERICAN: 4 mL/min — AB (ref >90)
GFR, EST NON AFRICAN AMERICAN: 4 mL/min — AB (ref >90)
GLUCOSE: 104 mg/dL — AB (ref 70–99)
POTASSIUM: 4.7 meq/L (ref 3.7–5.3)
SODIUM: 141 meq/L (ref 137–147)
Total Bilirubin: 0.2 mg/dL — ABNORMAL LOW (ref 0.3–1.2)
Total Protein: 8 g/dL (ref 6.0–8.3)

## 2013-08-26 LAB — FERRITIN: Ferritin: 336 ng/mL — ABNORMAL HIGH (ref 10–291)

## 2013-08-26 MED ORDER — EPOETIN ALFA 20000 UNIT/ML IJ SOLN
INTRAMUSCULAR | Status: AC
  Filled 2013-08-26: qty 1

## 2013-08-26 MED ORDER — EPOETIN ALFA 20000 UNIT/ML IJ SOLN
60000.0000 [IU] | Freq: Once | INTRAMUSCULAR | Status: AC
  Administered 2013-08-26: 60000 [IU] via SUBCUTANEOUS

## 2013-08-26 MED ORDER — EPOETIN ALFA 40000 UNIT/ML IJ SOLN
INTRAMUSCULAR | Status: AC
  Filled 2013-08-26: qty 1

## 2013-08-26 NOTE — Progress Notes (Signed)
Terri Kelly presents today for injection per MD orders. Procrit [id number] units administered SQ in left Abdomen and right abdomen. Administration without incident. Patient tolerated well.

## 2013-08-26 NOTE — Progress Notes (Signed)
Terri Kelly presented for labwork. Labs per MD order drawn via Peripheral Line 23 gauge needle inserted in right antecubital.  Good blood return present. Procedure without incident.  Needle removed intact. Patient tolerated procedure well.

## 2013-08-29 DIAGNOSIS — F32A Depression, unspecified: Secondary | ICD-10-CM | POA: Insufficient documentation

## 2013-08-29 DIAGNOSIS — F329 Major depressive disorder, single episode, unspecified: Secondary | ICD-10-CM | POA: Insufficient documentation

## 2013-08-29 DIAGNOSIS — E039 Hypothyroidism, unspecified: Secondary | ICD-10-CM | POA: Insufficient documentation

## 2013-08-29 DIAGNOSIS — N3090 Cystitis, unspecified without hematuria: Secondary | ICD-10-CM | POA: Insufficient documentation

## 2013-08-29 NOTE — Assessment & Plan Note (Signed)
Prolonged grief with depression, start fluoxetine and seek therapy

## 2013-08-29 NOTE — Assessment & Plan Note (Signed)
Symptomatic with abnormal UA, short course of cipro and f/u on c/s

## 2013-08-29 NOTE — Assessment & Plan Note (Signed)
Uncontrolled ,edication compliance an issue .Will  Adjust  meds at  next visit importance of compliance is stressed

## 2013-08-29 NOTE — Assessment & Plan Note (Signed)
Controlled, no change in medication  

## 2013-08-29 NOTE — Progress Notes (Signed)
   Subjective:    Patient ID: Terri Kelly, female    DOB: 1936-02-23, 78 y.o.   MRN: [id number]  HPI  The PT is here for follow up and re-evaluation of chronic medical conditions, medication management and review of any available recent lab and radiology data.  Preventive health is updated, specifically  Cancer screening and Immunization.   Questions or concerns regarding consultations or procedures which the PT has had in the interim are  Addressed.saw nephrologist, conservative mx only n dialysis The PT denies any adverse reactions to current medications since the last visit.  C/o in creased anxiety, depression and grief prolonged, wants help but no therapy 3 day h/o frequency and malodorous urine no fever  Or chills. \Generalized uncontrolled joint pains, no fall since last visit      Review of Systems See HPI Denies recent fever or chills. Denies sinus pressure, nasal congestion, ear pain or sore throat. Denies chest congestion, productive cough or wheezing. Denies chest pains, palpitations and leg swelling Denies abdominal pain, nausea, vomiting,diarrhea or constipation.    Denies headaches, seizures, numbness, or tingling. Rash under breast want topical med for this       Objective:   Physical Exam BP 154/84  Pulse 62  Resp 20  Ht 5' (1.524 m)  SpO2 97% Patient alert and oriented and in no cardiopulmonary distress.  HEENT: No facial asymmetry, EOMI,   oropharynx pink and moist.  Neck decreased ROM no JVD, no mass.  Chest: Clear to auscultation bilaterally.  CVS: S1, S2 no murmurs, no S3.  ABD: Soft  Mild suprapubic , no renal angle tenderness   Ext: No edema  MS: decreased  ROM spine, shoulders, hips and knees.  Skin: Intact, no ulcerations or rash noted.  Psych: Good eye contact, tearful, flat  affect. Memory mildly impaired, anxious and depressed and tearful  CNS: CN 2-12 intact, power,  normal throughout.no focal deficits noted.          Assessment & Plan:  HYPERTENSION Uncontrolled ,edication compliance an issue .Will  Adjust  meds at  next visit importance of compliance is stressed  Grief Prolonged grief with depression, start fluoxetine and seek therapy  HYPERLIPIDEMIA Controlled, no change in medication Hyperlipidemia:Low fat diet discussed and encouraged.    ESRD (end stage renal disease) Followed by nephrology, no interest in dialysis, children are aware  Hypothyroid undercorrect with recent increase in med dose , will re asess at next vfisit  GERD (gastroesophageal reflux disease) Controlled, no change in medication   Allergic rhinitis Recent flare of symptoms now currently controlled in meds she is taking  Knee pain Chronic pain med for generalized severe osteoarthritis as before  Cystitis Symptomatic with abnormal UA, short course of cipro and f/u on c/s  Depression Not suicidal or homicidal but sometimes wonders why she is still alive with her spouse gone, "feeels tired", start fluoxetine would benefit from therapy but no interest at this time, f/u in 6 to 8 week

## 2013-08-29 NOTE — Assessment & Plan Note (Signed)
Controlled, no change in medication Hyperlipidemia:Low fat diet discussed and encouraged.  \ 

## 2013-08-29 NOTE — Assessment & Plan Note (Signed)
undercorrect with recent increase in med dose , will re asess at next vfisit

## 2013-08-29 NOTE — Assessment & Plan Note (Signed)
Recent flare of symptoms now currently controlled in meds she is taking

## 2013-08-29 NOTE — Assessment & Plan Note (Signed)
Chronic pain med for generalized severe osteoarthritis as before

## 2013-08-29 NOTE — Assessment & Plan Note (Signed)
Not suicidal or homicidal but sometimes wonders why she is still alive with her spouse gone, "feeels tired", start fluoxetine would benefit from therapy but no interest at this time, f/u in 6 to 8 week

## 2013-08-29 NOTE — Assessment & Plan Note (Signed)
Followed by nephrology, no interest in dialysis, children are aware

## 2013-09-01 ENCOUNTER — Encounter (HOSPITAL_COMMUNITY): Payer: Medicare Other

## 2013-09-01 ENCOUNTER — Encounter (HOSPITAL_BASED_OUTPATIENT_CLINIC_OR_DEPARTMENT_OTHER): Payer: Medicare Other

## 2013-09-01 VITALS — BP 157/73 | HR 67

## 2013-09-01 DIAGNOSIS — D631 Anemia in chronic kidney disease: Secondary | ICD-10-CM

## 2013-09-01 DIAGNOSIS — N186 End stage renal disease: Secondary | ICD-10-CM

## 2013-09-01 DIAGNOSIS — N039 Chronic nephritic syndrome with unspecified morphologic changes: Secondary | ICD-10-CM

## 2013-09-01 DIAGNOSIS — D649 Anemia, unspecified: Secondary | ICD-10-CM

## 2013-09-01 LAB — CBC
HCT: 24.9 % — ABNORMAL LOW (ref 36.0–46.0)
Hemoglobin: 7.5 g/dL — ABNORMAL LOW (ref 12.0–15.0)
MCH: 24.7 pg — AB (ref 26.0–34.0)
MCHC: 30.1 g/dL (ref 30.0–36.0)
MCV: 81.9 fL (ref 78.0–100.0)
PLATELETS: 333 10*3/uL (ref 150–400)
RBC: 3.04 MIL/uL — ABNORMAL LOW (ref 3.87–5.11)
RDW: 23.9 % — AB (ref 11.5–15.5)
WBC: 11.2 10*3/uL — ABNORMAL HIGH (ref 4.0–10.5)

## 2013-09-01 MED ORDER — EPOETIN ALFA 20000 UNIT/ML IJ SOLN
INTRAMUSCULAR | Status: AC
  Filled 2013-09-01: qty 1

## 2013-09-01 MED ORDER — EPOETIN ALFA 20000 UNIT/ML IJ SOLN
60000.0000 [IU] | Freq: Once | INTRAMUSCULAR | Status: AC
  Administered 2013-09-01: 60000 [IU] via SUBCUTANEOUS

## 2013-09-01 MED ORDER — EPOETIN ALFA 40000 UNIT/ML IJ SOLN
INTRAMUSCULAR | Status: AC
  Filled 2013-09-01: qty 1

## 2013-09-01 NOTE — Progress Notes (Signed)
Terri Kelly presents today for injection per MD orders. Procrit [id number] units administered SQ in left Abdomen and tight abdomen in divided doses.. Administration without incident. Patient tolerated well.

## 2013-09-01 NOTE — Progress Notes (Signed)
Terri Kelly presented for labwork. Labs per MD order drawn via Peripheral Line 23 gauge needle inserted in RT AC  Good blood return present. Procedure without incident.  Needle removed intact. Patient tolerated procedure well.

## 2013-09-02 ENCOUNTER — Other Ambulatory Visit (HOSPITAL_COMMUNITY): Payer: Self-pay

## 2013-09-02 DIAGNOSIS — D649 Anemia, unspecified: Secondary | ICD-10-CM

## 2013-09-02 DIAGNOSIS — N186 End stage renal disease: Secondary | ICD-10-CM

## 2013-09-05 ENCOUNTER — Telehealth: Payer: Self-pay

## 2013-09-05 MED ORDER — CLOTRIMAZOLE-BETAMETHASONE 1-0.05 % EX CREA: 1.0000 "application " | TOPICAL_CREAM | Freq: Two times a day (BID) | CUTANEOUS | Status: DC

## 2013-09-05 MED ORDER — FLUCONAZOLE 150 MG PO TABS: ORAL_TABLET | ORAL | Status: DC

## 2013-09-05 NOTE — Telephone Encounter (Signed)
Can order be given for fluconazole and nystatin cream?

## 2013-09-05 NOTE — Telephone Encounter (Signed)
Patient aware.  Meds sent to pharmacy.

## 2013-09-05 NOTE — Addendum Note (Signed)
Addended by: Kandis FantasiaSLADE, COURTNEY B on: 09/05/2013 04:36 PM   Modules accepted: Orders

## 2013-09-05 NOTE — Telephone Encounter (Signed)
Yes pls give the fluconazole 150mg  one daily #5, and the cream or powder twice daily with refill x 1, I thought this was recently sent. Pls ask and document if there is skin breakdown witth bacterial infection will need keflex 500mg  twice dailty #10 if bacterial infection present  For itch, ok to send hydroxyzine 10mg  one at bedtime as needed #30 refill zero  ?? pls ask

## 2013-09-05 NOTE — Progress Notes (Signed)
Labs drawn

## 2013-09-07 ENCOUNTER — Telehealth: Payer: Self-pay | Admitting: Family Medicine

## 2013-09-07 NOTE — Telephone Encounter (Signed)
pls advise pt and one of her daughters responsible for her care, that she should not take lovastatin while on the fluconazole tablets to reduce risk of muscle aches

## 2013-09-08 ENCOUNTER — Telehealth: Payer: Self-pay

## 2013-09-08 MED ORDER — PREDNISONE 5 MG PO TABS: 5.0000 mg | ORAL_TABLET | Freq: Two times a day (BID) | ORAL | Status: DC

## 2013-09-08 MED ORDER — HYDROXYZINE HCL 10 MG PO TABS: 10.0000 mg | ORAL_TABLET | Freq: Two times a day (BID) | ORAL | Status: AC | PRN

## 2013-09-08 NOTE — Telephone Encounter (Signed)
Daughter aware of orders.  Patient will come in on 6/19

## 2013-09-08 NOTE — Telephone Encounter (Signed)
Depo medrol; 80 mg IM and erx pred 5mg  twice daily for 5 days only also erx hydroxyzine 10 mg twice daily as needed # 20 no refills, pls bring her in for nurse visit. Pls specifically ask and document any respiratory distress , ask if she is having probs  Breathing or swallowing and document

## 2013-09-08 NOTE — Addendum Note (Signed)
Addended by: Kandis FantasiaSLADE, COURTNEY B on: 09/08/2013 10:06 AM   Modules accepted: Orders

## 2013-09-08 NOTE — Telephone Encounter (Signed)
Meds sent to pharmacy. Called and let message for daughter per her request.  Awaiting call back from patient.

## 2013-09-08 NOTE — Telephone Encounter (Signed)
Omega aware.

## 2013-09-09 ENCOUNTER — Encounter (HOSPITAL_BASED_OUTPATIENT_CLINIC_OR_DEPARTMENT_OTHER): Payer: Medicare Other

## 2013-09-09 ENCOUNTER — Encounter (HOSPITAL_COMMUNITY): Payer: Medicare Other

## 2013-09-09 VITALS — BP 145/69 | HR 54 | Temp 97.4°F | Resp 20 | Wt 205.7 lb

## 2013-09-09 DIAGNOSIS — N186 End stage renal disease: Secondary | ICD-10-CM

## 2013-09-09 DIAGNOSIS — N185 Chronic kidney disease, stage 5: Secondary | ICD-10-CM

## 2013-09-09 DIAGNOSIS — D631 Anemia in chronic kidney disease: Secondary | ICD-10-CM

## 2013-09-09 DIAGNOSIS — N189 Chronic kidney disease, unspecified: Principal | ICD-10-CM

## 2013-09-09 DIAGNOSIS — N039 Chronic nephritic syndrome with unspecified morphologic changes: Secondary | ICD-10-CM

## 2013-09-09 DIAGNOSIS — D649 Anemia, unspecified: Secondary | ICD-10-CM

## 2013-09-09 LAB — CBC
HCT: 25.6 % — ABNORMAL LOW (ref 36.0–46.0)
Hemoglobin: 7.7 g/dL — ABNORMAL LOW (ref 12.0–15.0)
MCH: 24.8 pg — ABNORMAL LOW (ref 26.0–34.0)
MCHC: 30.1 g/dL (ref 30.0–36.0)
MCV: 82.6 fL (ref 78.0–100.0)
PLATELETS: 329 10*3/uL (ref 150–400)
RBC: 3.1 MIL/uL — ABNORMAL LOW (ref 3.87–5.11)
RDW: 23.5 % — AB (ref 11.5–15.5)
WBC: 12.8 10*3/uL — ABNORMAL HIGH (ref 4.0–10.5)

## 2013-09-09 MED ORDER — EPOETIN ALFA 20000 UNIT/ML IJ SOLN
60000.0000 [IU] | Freq: Once | INTRAMUSCULAR | Status: AC
  Administered 2013-09-09: 60000 [IU] via SUBCUTANEOUS

## 2013-09-09 MED ORDER — EPOETIN ALFA 40000 UNIT/ML IJ SOLN
INTRAMUSCULAR | Status: AC
  Filled 2013-09-09: qty 1

## 2013-09-09 MED ORDER — EPOETIN ALFA 20000 UNIT/ML IJ SOLN
INTRAMUSCULAR | Status: AC
  Filled 2013-09-09: qty 1

## 2013-09-09 NOTE — Progress Notes (Signed)
Injection given per Tomasita MorrowH. Bray RN at MD visit.

## 2013-09-09 NOTE — Progress Notes (Signed)
[place]  OFFICE PROGRESS NOTE  Terri Nakayama, MD [address] [place] [address]  DIAGNOSIS: Anemia in chronic kidney disease - Plan: CBC with Differential, Ferritin  ESRD (end stage renal disease) - Plan: CBC with Differential, Ferritin, Comprehensive metabolic panel, SCHEDULING COMMUNICATION INJECTION, CBC, PROCRIT TREATMENT CONDITION, SCHEDULING COMMUNICATION LAB, epoetin alfa (EPOGEN,PROCRIT) injection 60,000 Units, PROCRIT TREATMENT CONDITION  Normocytic anemia - Plan: SCHEDULING COMMUNICATION INJECTION, CBC, PROCRIT TREATMENT CONDITION, SCHEDULING COMMUNICATION LAB, epoetin alfa (EPOGEN,PROCRIT) injection 60,000 Units, PROCRIT TREATMENT CONDITION  Chief Complaint  Patient presents with  . Anemia in chronic kidney disease, stage V    CURRENT THERAPY:  Procrit subcutaneously receiving 60,000 units subcutaneously weekly on 08/26/2013 and 09/01/2013 4 anemia of chronic disease in chronic kidney disease, stage V, refusing dialysis.  INTERVAL HISTORY: Terri Kelly 78 y.o. female returns for  followup a receiving Procrit therapy for anemia of chronic disease in chronic kidney disease, stage 5, refusing dialysis.  Fortunately or unfortunately the patient a 2 gallon surgical versus a 24-hour period and developed a skin rash and in the blotchy fashion on the chest wall as well as the extremities. She's been treated with prednisone, Benadryl, and a topical steroid with improvement. Appetite and good with no nausea, vomiting, melena, hematochezia, hematuria, epistaxis, hemoptysis, chest pain, PND, orthopnea, palpitations, lower extremity swelling or redness, fever, or night sweats. She continues to make urine.  MEDICAL HISTORY: Past Medical History  Diagnosis Date  . Hyperlipidemia   . Obesity   . Hypertension   . Diabetes mellitus type 1   . Back pain   . Sleep apnea 2009    non compliant with c-pap  . Diastolic  dysfunction Nov 2011    Hospitalised for 3 days at [facility]   . Normocytic anemia 04/09/2012  . Allergy   . Arthritis   . Chronic kidney disease     does not want HD    INTERIM HISTORY: has IGT (impaired glucose tolerance); HYPERLIPIDEMIA; OBESITY; HYPERTENSION; ESRD (end stage renal disease); HIP PAIN, LEFT; Osteoarthritis of both knees; BACK PAIN; DISORDER OF BONE AND CARTILAGE UNSPECIFIED; FATIGUE; Nonspecific abnormal results of thyroid function study; Hyperparathyroidism, secondary renal; Normocytic anemia; Neck muscle spasm; Grief; Hypocalcemia; Hypomagnesemia; Prolonged Q-T interval on ECG; Acute on chronic diastolic CHF (congestive heart failure); Anemia in chronic kidney disease; GERD (gastroesophageal reflux disease); OA (osteoarthritis) of knee; Knee pain; Anemia in chronic kidney disease(285.21); Insomnia; Allergic rhinitis; Hypothyroid; Cystitis; and Depression on her problem list.    ALLERGIES:  is allergic to ace inhibitors; angiotensin receptor blockers; and other.  MEDICATIONS: has a current medication list which includes the following prescription(s): amlodipine, aspirin, calcium-vitamin d, clotrimazole-betamethasone, diphenhydramine, fluoxetine, fluticasone, furosemide, hydralazine, hydrocodone-acetaminophen, hydroxyzine, labetalol, levothyroxine, lovastatin, montelukast, nystatin cream, nystatin-triamcinolone ointment, omeprazole, prednisone, temazepam, folic acid, and thiamine hcl, and the following Facility-Administered Medications: epoetin alfa.  SURGICAL HISTORY:  Past Surgical History  Procedure Laterality Date  . Vaginal hysterectomy  1978  . Myomectomy  1978  . Right knee arthroscopy  1976  . Right knee replacement      FAMILY HISTORY: family history includes Heart disease in her father; Hypertension in her brother, brother, and sister; Kidney disease in her father; Kidney failure in her brother and sister.  SOCIAL HISTORY:  reports that she has quit smoking. She has  never used smokeless tobacco. She reports that she does not drink alcohol or use illicit drugs.  REVIEW OF SYSTEMS:  Other than that discussed above is noncontributory.  PHYSICAL EXAMINATION: ECOG PERFORMANCE STATUS: 1 - Symptomatic but completely ambulatory  Blood pressure 145/69, pulse 54, temperature 97.4 F (36.3 C), temperature source Oral, resp. rate 20, weight 205 lb 11.2 oz (93.305 kg).  GENERAL:alert, no distress and comfortable SKIN: erythematous rash fading on the chest wall as well as upper extremities.  EYES: PERLA; Conjunctiva are pink and non-injected, sclera clear SINUSES: No redness or tenderness over maxillary or ethmoid sinuses OROPHARYNX:no exudate, no erythema on lips, buccal mucosa, or tongue. NECK: supple, thyroid normal size, non-tender, without nodularity. No masses CHEST:  normal AP diameter with no breast masses.. Moderately obese. LYMPH:  no palpable lymphadenopathy in the cervical, axillary or inguinal LUNGS: clear to auscultation and percussion with normal breathing effort HEART: regular rate & rhythm and no murmurs. no friction rub.  ABDOMEN:abdomen soft, non-tender and normal bowel sounds MUSCULOSKELETAL:no cyanosis of digits and no clubbing. Range of motion normal.  NEURO: alert & oriented x 3 with fluent speech, no focal motor/sensory deficits   LABORATORY DATA:   Results for Terri Kelly, Terri Kelly (MRN [id number]) as of 09/09/2013 13:01  Ref. Range 05/27/2013 13:15 07/13/2013 12:07 08/05/2013 14:30 08/26/2013 14:00  Ferritin Latest Range: 10-291 ng/mL 131 388 (H) 436 (H) 336 (H)     Results for Terri Kelly, Terri Kelly (MRN [id number]) as of 09/09/2013 13:01  Ref. Range 05/27/2013 13:15 07/13/2013 12:06 08/05/2013 14:10 08/19/2013 14:00 08/26/2013 14:00 09/01/2013 14:02  Hemoglobin Latest Range: 12.0-15.0 g/dL 9.7 (L) 8.2 (L) 6.9 (LL) 6.6 (LL) 7.2 (L) 7.5 (L)     Lab on 09/09/2013  Component Date Value Ref Range Status  . WBC 09/09/2013 12.8* 4.0 - 10.5 K/uL Final  .  RBC 09/09/2013 3.10* 3.87 - 5.11 MIL/uL Final  . Hemoglobin 09/09/2013 7.7* 12.0 - 15.0 g/dL Final  . HCT 09/09/2013 25.6* 36.0 - 46.0 % Final  . MCV 09/09/2013 82.6  78.0 - 100.0 fL Final  . MCH 09/09/2013 24.8* 26.0 - 34.0 pg Final  . MCHC 09/09/2013 30.1  30.0 - 36.0 g/dL Final  . RDW 09/09/2013 23.5* 11.5 - 15.5 % Final  . Platelets 09/09/2013 329  150 - 400 K/uL Final  Infusion on 09/01/2013  Component Date Value Ref Range Status  . WBC 09/01/2013 11.2* 4.0 - 10.5 K/uL Final  . RBC 09/01/2013 3.04* 3.87 - 5.11 MIL/uL Final  . Hemoglobin 09/01/2013 7.5* 12.0 - 15.0 g/dL Final  . HCT 09/01/2013 24.9* 36.0 - 46.0 % Final  . MCV 09/01/2013 81.9  78.0 - 100.0 fL Final  . MCH 09/01/2013 24.7* 26.0 - 34.0 pg Final  . MCHC 09/01/2013 30.1  30.0 - 36.0 g/dL Final  . RDW 09/01/2013 23.9* 11.5 - 15.5 % Final  . Platelets 09/01/2013 333  150 - 400 K/uL Final  Infusion on 08/26/2013  Component Date Value Ref Range Status  . WBC 08/26/2013 11.2* 4.0 - 10.5 K/uL Final  . RBC 08/26/2013 2.93* 3.87 - 5.11 MIL/uL Final  . Hemoglobin 08/26/2013 7.2* 12.0 - 15.0 g/dL Final  . HCT 08/26/2013 23.5* 36.0 - 46.0 % Final  . MCV 08/26/2013 80.2  78.0 - 100.0 fL Final  . MCH 08/26/2013 24.6* 26.0 - 34.0 pg Final  . MCHC 08/26/2013 30.6  30.0 - 36.0 g/dL Final  . RDW 08/26/2013 23.5* 11.5 - 15.5 % Final  . Platelets 08/26/2013 280  150 - 400 K/uL Final  . Neutrophils Relative % 08/26/2013 74  43 - 77 % Final  . Lymphocytes Relative  08/26/2013 12  12 - 46 % Final  . Monocytes Relative 08/26/2013 10  3 - 12 % Final  . Eosinophils Relative 08/26/2013 4  0 - 5 % Final  . Basophils Relative 08/26/2013 0  0 - 1 % Final  . Neutro Abs 08/26/2013 8.4* 1.7 - 7.7 K/uL Final  . Lymphs Abs 08/26/2013 1.3  0.7 - 4.0 K/uL Final  . Monocytes Absolute 08/26/2013 1.1* 0.1 - 1.0 K/uL Final  . Eosinophils Absolute 08/26/2013 0.4  0.0 - 0.7 K/uL Final  . Basophils Absolute 08/26/2013 0.0  0.0 - 0.1 K/uL Final  . RBC  Morphology 08/26/2013 POLYCHROMASIA PRESENT   Final  . Ferritin 08/26/2013 336* 10 - 291 ng/mL Final   Performed at [facility]  . Sodium 08/26/2013 141  137 - 147 mEq/L Final  . Potassium 08/26/2013 4.7  3.7 - 5.3 mEq/L Final  . Chloride 08/26/2013 103  96 - 112 mEq/L Final  . CO2 08/26/2013 15* 19 - 32 mEq/L Final  . Glucose, Bld 08/26/2013 104* 70 - 99 mg/dL Final  . BUN 08/26/2013 101* 6 - 23 mg/dL Final  . Creatinine, Ser 08/26/2013 9.15* 0.50 - 1.10 mg/dL Final  . Calcium 08/26/2013 9.0  8.4 - 10.5 mg/dL Final  . Total Protein 08/26/2013 8.0  6.0 - 8.3 g/dL Final  . Albumin 08/26/2013 4.0  3.5 - 5.2 g/dL Final  . AST 08/26/2013 11  0 - 37 U/L Final  . ALT 08/26/2013 9  0 - 35 U/L Final  . Alkaline Phosphatase 08/26/2013 89  39 - 117 U/L Final  . Total Bilirubin 08/26/2013 0.2* 0.3 - 1.2 mg/dL Final  . GFR calc non Af Amer 08/26/2013 4* >90 mL/min Final  . GFR calc Af Amer 08/26/2013 4* >90 mL/min Final   Comment: (NOTE)                          The eGFR has been calculated using the CKD EPI equation.                          This calculation has not been validated in all clinical situations.                          eGFR's persistently <90 mL/min signify possible Chronic Kidney                          Disease.  Infusion on 08/19/2013  Component Date Value Ref Range Status  . WBC 08/19/2013 9.9  4.0 - 10.5 K/uL Final  . RBC 08/19/2013 2.78* 3.87 - 5.11 MIL/uL Final   Comment: POLYCHROMASIA PRESENT                          SCHISTOCYTES PRESENT (2-5/hpf)  . Hemoglobin 08/19/2013 6.6* 12.0 - 15.0 g/dL Final   Comment: RESULT REPEATED AND VERIFIED                          CRITICAL RESULT CALLED TO, READ BACK BY AND VERIFIED WITH:                          S.BURGESS AT 1419 ON 08/19/13 BY S.VANHOORNE  . HCT 08/19/2013 21.9* 36.0 - 46.0 % Final  .  MCV 08/19/2013 78.8  78.0 - 100.0 fL Final  . MCH 08/19/2013 23.7* 26.0 - 34.0 pg Final  . MCHC 08/19/2013 30.1  30.0 - 36.0  g/dL Final  . RDW 08/19/2013 22.5* 11.5 - 15.5 % Final  . Platelets 08/19/2013 221  150 - 400 K/uL Final  . Neutrophils Relative % 08/19/2013 77  43 - 77 % Final  . Neutro Abs 08/19/2013 7.6  1.7 - 7.7 K/uL Final  . Lymphocytes Relative 08/19/2013 14  12 - 46 % Final  . Lymphs Abs 08/19/2013 1.3  0.7 - 4.0 K/uL Final  . Monocytes Relative 08/19/2013 6  3 - 12 % Final  . Monocytes Absolute 08/19/2013 0.6  0.1 - 1.0 K/uL Final  . Eosinophils Relative 08/19/2013 4  0 - 5 % Final  . Eosinophils Absolute 08/19/2013 0.4  0.0 - 0.7 K/uL Final  . Basophils Relative 08/19/2013 0  0 - 1 % Final  . Basophils Absolute 08/19/2013 0.0  0.0 - 0.1 K/uL Final  Office Visit on 08/17/2013  Component Date Value Ref Range Status  . Color, UA 08/17/2013 yellow   Final  . Clarity, UA 08/17/2013 clear   Final  . Glucose, UA 08/17/2013 neg   Final  . Bilirubin, UA 08/17/2013 neg   Final  . Ketones, UA 08/17/2013 neg   Final  . Spec Grav, UA 08/17/2013 1.020   Final  . Blood, UA 08/17/2013 trace lysed   Final  . pH, UA 08/17/2013 6.0   Final  . Protein, UA 08/17/2013 >38m/dL   Final  . Urobilinogen, UA 08/17/2013 0.2   Final  . Nitrite, UA 08/17/2013 neg   Final  . Leukocytes, UA 08/17/2013 moderate (2+)   Final  . Colony Count 08/17/2013 >=100,000 COLONIES/ML   Final  . Organism ID, Bacteria 08/17/2013 Multiple bacterial morphotypes present, none   Final  . Organism ID, Bacteria 08/17/2013 predominant. Suggest appropriate recollection if    Final  . Organism ID, Bacteria 08/17/2013 clinically indicated.   Final    PATHOLOGY: no new pathology.  Urinalysis    Component Value Date/Time   COLORURINE YELLOW 09/27/2012 1238   APPEARANCEUR CLEAR 09/27/2012 1238   LABSPEC 1.020 09/27/2012 1238   PHURINE 7.0 09/27/2012 1238   GLUCOSEU NEGATIVE 09/27/2012 1238   HGBUR SMALL* 09/27/2012 1238   BILIRUBINUR neg 08/17/2013 1613   BILIRUBINUR NEGATIVE 09/27/2012 1238   KETONESUR NEGATIVE 09/27/2012 1238   PROTEINUR  >303mdL 08/17/2013 1613   PROTEINUR 100* 09/27/2012 1238   UROBILINOGEN 0.2 08/17/2013 1613   UROBILINOGEN 0.2 09/27/2012 1238   NITRITE neg 08/17/2013 1613   NITRITE NEGATIVE 09/27/2012 1238   LEUKOCYTESUR moderate (2+) 08/17/2013 1613    RADIOGRAPHIC STUDIES: No results found.  ASSESSMENT:  #1.Anemia of chronic disease in the setting of stage IV renal disease, refusing dialysis, stable hemoglobin, for Procrit today.  #2. End stage renal disease, stage IV with creatinine over 7, refusing dialysis.  #3. Obstructive sleep apnea syndrome, on CPAP.  #4. Diabetes mellitus, type II, controlled.  #5. Diastolic cardiac dysfunction  #6. Probable spinal stenosis #7. Skin rash secondary to over indulgence of strawberries orally.    PLAN:  #1. Procrit 60,000 units subcutaneously and weekly until hemoglobin reaches closer to 11. #2. Office visit in 4 weeks with CBC and ferritin.   All questions were answered. The patient knows to call the clinic with any problems, questions or concerns. We can certainly see the patient much sooner if necessary.   I spent 25  minutes counseling the patient face to face. The total time spent in the appointment was 30 minutes.    Doroteo Bradford, MD 09/09/2013 1:29 PM  DISCLAIMER:  This note was dictated with voice recognition software.  Similar sounding words can inadvertently be transcribed inaccurately and may not be corrected upon review.

## 2013-09-09 NOTE — Progress Notes (Signed)
Terri Kelly presents today for injection per MD orders. Procrit 60,000 units administered SQ in left Abdomen. Administration without incident. Patient tolerated well.

## 2013-09-09 NOTE — Patient Instructions (Addendum)
[facility] Discharge Instructions  RECOMMENDATIONS MADE BY THE CONSULTANT AND ANY TEST RESULTS WILL BE SENT TO YOUR REFERRING PHYSICIAN.  EXAM FINDINGS BY THE PHYSICIAN TODAY AND SIGNS OR SYMPTOMS TO REPORT TO CLINIC OR PRIMARY PHYSICIAN:   Return to see Dr. Zigmund DanielFormanek in 4 weeks Friday 10/07/13 @ 1:00  Labs weekly with Procrit injection if your Hemoglobin is still low.  Next appointment for labs Friday 09/16/13 @ 1:10  Today you received Procrit 60,000 units in your abdomen for your low hemoglobin.     Thank you for choosing [facility] to provide your oncology and hematology care.  To afford each patient quality time with our providers, please arrive at least 15 minutes before your scheduled appointment time.  With your help, our goal is to use those 15 minutes to complete the necessary work-up to ensure our physicians have the information they need to help with your evaluation and healthcare recommendations.    Effective January 1st, 2014, we ask that you re-schedule your appointment with our physicians should you arrive 10 or more minutes late for your appointment.  We strive to give you quality time with our providers, and arriving late affects you and other patients whose appointments are after yours.    Again, thank you for choosing [facility].  Our hope is that these requests will decrease the amount of time that you wait before being seen by our physicians.       _____________________________________________________________  Should you have questions after your visit to [facility], please contact our office at [phone] between the hours of 8:30 a.m. and 5:00 p.m.  Voicemails left after 4:30 p.m. will not be returned until the following business day.  For prescription refill requests, have your pharmacy contact our office with your prescription refill request.

## 2013-09-09 NOTE — Progress Notes (Signed)
LABS DRAWN FOR CBC 

## 2013-09-15 ENCOUNTER — Inpatient Hospital Stay (HOSPITAL_COMMUNITY)
Admission: EM | Admit: 2013-09-15 | Discharge: 2013-09-16 | DRG: 291 | Disposition: A | Discharge status: Hospice | Payer: Medicare Other | Attending: Internal Medicine | Admitting: Internal Medicine

## 2013-09-15 ENCOUNTER — Encounter (HOSPITAL_COMMUNITY): Payer: Self-pay | Admitting: Emergency Medicine

## 2013-09-15 ENCOUNTER — Emergency Department (HOSPITAL_COMMUNITY): Payer: Medicare Other

## 2013-09-15 DIAGNOSIS — Z8249 Family history of ischemic heart disease and other diseases of the circulatory system: Secondary | ICD-10-CM

## 2013-09-15 DIAGNOSIS — E8779 Other fluid overload: Secondary | ICD-10-CM

## 2013-09-15 DIAGNOSIS — E8770 Fluid overload, unspecified: Secondary | ICD-10-CM | POA: Diagnosis present

## 2013-09-15 DIAGNOSIS — N186 End stage renal disease: Secondary | ICD-10-CM | POA: Diagnosis present

## 2013-09-15 DIAGNOSIS — F329 Major depressive disorder, single episode, unspecified: Secondary | ICD-10-CM

## 2013-09-15 DIAGNOSIS — G4700 Insomnia, unspecified: Secondary | ICD-10-CM

## 2013-09-15 DIAGNOSIS — M129 Arthropathy, unspecified: Secondary | ICD-10-CM | POA: Diagnosis present

## 2013-09-15 DIAGNOSIS — N2581 Secondary hyperparathyroidism of renal origin: Secondary | ICD-10-CM

## 2013-09-15 DIAGNOSIS — E669 Obesity, unspecified: Secondary | ICD-10-CM

## 2013-09-15 DIAGNOSIS — R5381 Other malaise: Secondary | ICD-10-CM

## 2013-09-15 DIAGNOSIS — R0602 Shortness of breath: Secondary | ICD-10-CM | POA: Diagnosis present

## 2013-09-15 DIAGNOSIS — R9431 Abnormal electrocardiogram [ECG] [EKG]: Secondary | ICD-10-CM

## 2013-09-15 DIAGNOSIS — E785 Hyperlipidemia, unspecified: Secondary | ICD-10-CM

## 2013-09-15 DIAGNOSIS — Z9119 Patient's noncompliance with other medical treatment and regimen: Secondary | ICD-10-CM

## 2013-09-15 DIAGNOSIS — I5033 Acute on chronic diastolic (congestive) heart failure: Secondary | ICD-10-CM

## 2013-09-15 DIAGNOSIS — G4730 Sleep apnea, unspecified: Secondary | ICD-10-CM | POA: Diagnosis present

## 2013-09-15 DIAGNOSIS — F4321 Adjustment disorder with depressed mood: Secondary | ICD-10-CM

## 2013-09-15 DIAGNOSIS — E872 Acidosis, unspecified: Secondary | ICD-10-CM | POA: Diagnosis present

## 2013-09-15 DIAGNOSIS — N3090 Cystitis, unspecified without hematuria: Secondary | ICD-10-CM

## 2013-09-15 DIAGNOSIS — Z6841 Body Mass Index (BMI) 40.0 and over, adult: Secondary | ICD-10-CM

## 2013-09-15 DIAGNOSIS — Z91199 Patient's noncompliance with other medical treatment and regimen due to unspecified reason: Secondary | ICD-10-CM

## 2013-09-15 DIAGNOSIS — N189 Chronic kidney disease, unspecified: Secondary | ICD-10-CM

## 2013-09-15 DIAGNOSIS — I10 Essential (primary) hypertension: Secondary | ICD-10-CM | POA: Diagnosis present

## 2013-09-15 DIAGNOSIS — E034 Atrophy of thyroid (acquired): Secondary | ICD-10-CM

## 2013-09-15 DIAGNOSIS — M899 Disorder of bone, unspecified: Secondary | ICD-10-CM

## 2013-09-15 DIAGNOSIS — F32A Depression, unspecified: Secondary | ICD-10-CM

## 2013-09-15 DIAGNOSIS — E109 Type 1 diabetes mellitus without complications: Secondary | ICD-10-CM | POA: Diagnosis present

## 2013-09-15 DIAGNOSIS — D649 Anemia, unspecified: Secondary | ICD-10-CM

## 2013-09-15 DIAGNOSIS — Z66 Do not resuscitate: Secondary | ICD-10-CM | POA: Diagnosis present

## 2013-09-15 DIAGNOSIS — N039 Chronic nephritic syndrome with unspecified morphologic changes: Secondary | ICD-10-CM

## 2013-09-15 DIAGNOSIS — M949 Disorder of cartilage, unspecified: Secondary | ICD-10-CM

## 2013-09-15 DIAGNOSIS — I509 Heart failure, unspecified: Secondary | ICD-10-CM

## 2013-09-15 DIAGNOSIS — Z7982 Long term (current) use of aspirin: Secondary | ICD-10-CM

## 2013-09-15 DIAGNOSIS — R946 Abnormal results of thyroid function studies: Secondary | ICD-10-CM

## 2013-09-15 DIAGNOSIS — R5383 Other fatigue: Secondary | ICD-10-CM

## 2013-09-15 DIAGNOSIS — Z794 Long term (current) use of insulin: Secondary | ICD-10-CM

## 2013-09-15 DIAGNOSIS — Z96659 Presence of unspecified artificial knee joint: Secondary | ICD-10-CM

## 2013-09-15 DIAGNOSIS — R7302 Impaired glucose tolerance (oral): Secondary | ICD-10-CM

## 2013-09-15 DIAGNOSIS — E875 Hyperkalemia: Secondary | ICD-10-CM | POA: Diagnosis present

## 2013-09-15 DIAGNOSIS — M549 Dorsalgia, unspecified: Secondary | ICD-10-CM

## 2013-09-15 DIAGNOSIS — I5031 Acute diastolic (congestive) heart failure: Principal | ICD-10-CM | POA: Diagnosis present

## 2013-09-15 DIAGNOSIS — J9600 Acute respiratory failure, unspecified whether with hypoxia or hypercapnia: Secondary | ICD-10-CM | POA: Diagnosis present

## 2013-09-15 DIAGNOSIS — D631 Anemia in chronic kidney disease: Secondary | ICD-10-CM | POA: Diagnosis present

## 2013-09-15 DIAGNOSIS — M62838 Other muscle spasm: Secondary | ICD-10-CM

## 2013-09-15 DIAGNOSIS — I120 Hypertensive chronic kidney disease with stage 5 chronic kidney disease or end stage renal disease: Secondary | ICD-10-CM | POA: Diagnosis present

## 2013-09-15 DIAGNOSIS — Z87891 Personal history of nicotine dependence: Secondary | ICD-10-CM

## 2013-09-15 DIAGNOSIS — Z515 Encounter for palliative care: Secondary | ICD-10-CM

## 2013-09-15 LAB — BASIC METABOLIC PANEL
BUN: 167 mg/dL — AB (ref 6–23)
CHLORIDE: 99 meq/L (ref 96–112)
CO2: 11 meq/L — AB (ref 19–32)
Calcium: 9 mg/dL (ref 8.4–10.5)
Creatinine, Ser: 12.11 mg/dL — ABNORMAL HIGH (ref 0.50–1.10)
GFR calc non Af Amer: 3 mL/min — ABNORMAL LOW (ref >90)
GFR, EST AFRICAN AMERICAN: 3 mL/min — AB (ref >90)
Glucose, Bld: 120 mg/dL — ABNORMAL HIGH (ref 70–99)
Potassium: 6.8 mEq/L (ref 3.7–5.3)
Sodium: 139 mEq/L (ref 137–147)

## 2013-09-15 LAB — CBC WITH DIFFERENTIAL/PLATELET
BASOS PCT: 0 % (ref 0–1)
Basophils Absolute: 0 10*3/uL (ref 0.0–0.1)
EOS PCT: 1 % (ref 0–5)
Eosinophils Absolute: 0.1 10*3/uL (ref 0.0–0.7)
HCT: 23.3 % — ABNORMAL LOW (ref 36.0–46.0)
HEMOGLOBIN: 7.3 g/dL — AB (ref 12.0–15.0)
LYMPHS ABS: 1 10*3/uL (ref 0.7–4.0)
Lymphocytes Relative: 8 % — ABNORMAL LOW (ref 12–46)
MCH: 25.5 pg — AB (ref 26.0–34.0)
MCHC: 31.3 g/dL (ref 30.0–36.0)
MCV: 81.5 fL (ref 78.0–100.0)
MONOS PCT: 5 % (ref 3–12)
Monocytes Absolute: 0.6 10*3/uL (ref 0.1–1.0)
Neutro Abs: 10.5 10*3/uL — ABNORMAL HIGH (ref 1.7–7.7)
Neutrophils Relative %: 86 % — ABNORMAL HIGH (ref 43–77)
Platelets: 244 10*3/uL (ref 150–400)
RBC: 2.86 MIL/uL — AB (ref 3.87–5.11)
RDW: 23.5 % — ABNORMAL HIGH (ref 11.5–15.5)
WBC: 12.2 10*3/uL — AB (ref 4.0–10.5)

## 2013-09-15 LAB — TROPONIN I

## 2013-09-15 MED ORDER — DEXTROSE 50 % IV SOLN
INTRAVENOUS | Status: AC
  Filled 2013-09-15: qty 50

## 2013-09-15 MED ORDER — ASPIRIN 81 MG PO CHEW
324.0000 mg | CHEWABLE_TABLET | Freq: Once | ORAL | Status: AC
  Administered 2013-09-15: 324 mg via ORAL
  Filled 2013-09-15: qty 4

## 2013-09-15 MED ORDER — SODIUM POLYSTYRENE SULFONATE 15 GM/60ML PO SUSP
30.0000 g | Freq: Once | ORAL | Status: AC
  Administered 2013-09-15: 30 g via ORAL
  Filled 2013-09-15: qty 120

## 2013-09-15 MED ORDER — DEXTROSE 50 % IV SOLN
1.0000 | Freq: Once | INTRAVENOUS | Status: AC
  Administered 2013-09-15: 50 mL via INTRAVENOUS

## 2013-09-15 MED ORDER — SODIUM BICARBONATE 8.4 % IV SOLN
INTRAVENOUS | Status: AC
  Filled 2013-09-15: qty 100

## 2013-09-15 MED ORDER — MORPHINE SULFATE 4 MG/ML IJ SOLN
4.0000 mg | Freq: Once | INTRAMUSCULAR | Status: AC
  Administered 2013-09-15: 4 mg via INTRAVENOUS
  Filled 2013-09-15: qty 1

## 2013-09-15 MED ORDER — ALBUTEROL SULFATE (2.5 MG/3ML) 0.083% IN NEBU
10.0000 mg | INHALATION_SOLUTION | Freq: Once | RESPIRATORY_TRACT | Status: AC
Start: 2013-09-15 — End: 2013-09-15
  Administered 2013-09-15: 10 mg via RESPIRATORY_TRACT
  Filled 2013-09-15: qty 12

## 2013-09-15 MED ORDER — FUROSEMIDE 10 MG/ML IJ SOLN
160.0000 mg | Freq: Once | INTRAVENOUS | Status: AC
  Administered 2013-09-16: 160 mg via INTRAVENOUS
  Filled 2013-09-15: qty 16

## 2013-09-15 MED ORDER — SODIUM BICARBONATE 8.4 % IV SOLN
Freq: Once | INTRAVENOUS | Status: AC
  Administered 2013-09-16: via INTRAVENOUS
  Filled 2013-09-15: qty 100

## 2013-09-15 MED ORDER — INSULIN ASPART 100 UNIT/ML IV SOLN
10.0000 [IU] | Freq: Once | INTRAVENOUS | Status: AC
  Administered 2013-09-15: 10 [IU] via INTRAVENOUS

## 2013-09-15 MED ORDER — FUROSEMIDE 10 MG/ML IJ SOLN
INTRAMUSCULAR | Status: AC
  Filled 2013-09-15: qty 20

## 2013-09-15 NOTE — ED Provider Notes (Addendum)
CSN: [id number]     Arrival date & time 09/15/13  2111 History   First MD Initiated Contact with Patient 09/15/13 2258   This chart was scribed for Dione Booze, MD by Valera Castle, ED Scribe. This patient was seen in room APA18/APA18 and the patient's care was started at 11:02 PM.   Chief Complaint  Patient presents with  . Chest Pain    (Consider location/radiation/quality/duration/timing/severity/associated sxs/prior Treatment) The history is provided by the patient. No language interpreter was used.   HPI Comments: Terri Kelly is a 78 y.o. female who presents to the Emergency Department complaining of acute onset of constant, dull, lower sternal CP that does not radiate, onset earlier today around 8:00 PM after waking up from a nap. Patient states that the pain lasted for a few minutes with 8/10 severity. Pt denies current chest pain. Pt states that laying down exacerbates her chest pain. Pt also has associated symptoms of SOB. Pt is DNI and DNR.  Past Medical History  Diagnosis Date  . Hyperlipidemia   . Obesity   . Hypertension   . Diabetes mellitus type 1   . Back pain   . Sleep apnea 2009    non compliant with c-pap  . Diastolic dysfunction Nov 2011    Hospitalised for 3 days at [facility]   . Normocytic anemia 04/09/2012  . Allergy   . Arthritis   . Chronic kidney disease     does not want HD   Past Surgical History  Procedure Laterality Date  . Vaginal hysterectomy  1978  . Myomectomy  1978  . Right knee arthroscopy  1976  . Right knee replacement     Family History  Problem Relation Age of Onset  . Heart disease Father   . Kidney disease Father   . Hypertension Brother     x1  . Hypertension Sister     x2  . Kidney failure Sister   . Kidney failure Brother   . Hypertension Brother    History  Substance Use Topics  . Smoking status: Former Games developer  . Smokeless tobacco: Never Used  . Alcohol Use: No   OB History   Grav Para Term Preterm Abortions TAB SAB  Ect Mult Living                 Review of Systems  Constitutional: Negative for chills.  Gastrointestinal: Negative for diarrhea.  All other systems reviewed and are negative.   Allergies  Ace inhibitors; Angiotensin receptor blockers; and Other  Home Medications   Prior to Admission medications   Medication Sig Start Date End Date Taking? Authorizing Provider  amLODipine (NORVASC) 10 MG tablet Take 1 tablet (10 mg total) by mouth daily. 03/21/13  Yes Kerri Perches, MD  aspirin (ASPIRIN LOW DOSE) 81 MG EC tablet Take 81 mg by mouth daily.     Yes Historical Provider, MD  calcium-vitamin D (OSCAL WITH D) 500-200 MG-UNIT per tablet Take 1 tablet by mouth 3 (three) times daily. 09/28/12  Yes Erick Blinks, MD  clotrimazole-betamethasone (LOTRISONE) cream Apply 1 application topically 2 (two) times daily. 09/05/13  Yes Kerri Perches, MD  FLUoxetine (PROZAC) 10 MG tablet Take 1 tablet (10 mg total) by mouth daily. 08/17/13  Yes Kerri Perches, MD  fluticasone (FLONASE) 50 MCG/ACT nasal spray Place 2 sprays into both nostrils daily. 07/26/13  Yes Kerri Perches, MD  folic acid (FOLVITE) 1 MG tablet Take 1 tablet (1 mg total) by  mouth daily. 03/21/13  Yes Kerri PerchesMargaret E Simpson, MD  furosemide (LASIX) 80 MG tablet Take 1 tablet (80 mg total) by mouth 2 (two) times daily. 03/29/13  Yes Kerri PerchesMargaret E Simpson, MD  hydrALAZINE (APRESOLINE) 50 MG tablet Take 50 mg by mouth 2 (two) times daily.   Yes Historical Provider, MD  HYDROcodone-acetaminophen (NORCO) 7.5-325 MG per tablet Take 1 tablet by mouth 3 (three) times daily as needed for moderate pain. pain   Yes Historical Provider, MD  labetalol (NORMODYNE) 200 MG tablet Take 1 tablet (200 mg total) by mouth 2 (two) times daily. 03/21/13  Yes Kerri PerchesMargaret E Simpson, MD  levothyroxine (SYNTHROID, LEVOTHROID) 75 MCG tablet Take 1 tablet (75 mcg total) by mouth daily before breakfast. 08/17/13 03/17/14 Yes Kerri PerchesMargaret E Simpson, MD  lovastatin (MEVACOR)  40 MG tablet Take 2 tablets (80 mg total) by mouth at bedtime. 03/21/13  Yes Kerri PerchesMargaret E Simpson, MD  montelukast (SINGULAIR) 10 MG tablet Take 1 tablet (10 mg total) by mouth at bedtime. 07/26/13  Yes Kerri PerchesMargaret E Simpson, MD  nystatin-triamcinolone ointment [facility](MYCOLOG) Two times daily to affected area x 1 week then daily as needed 08/17/13  Yes Kerri PerchesMargaret E Simpson, MD  omeprazole (PRILOSEC) 20 MG capsule Take 1 capsule (20 mg total) by mouth daily. 03/21/13 03/21/14 Yes Kerri PerchesMargaret E Simpson, MD  temazepam (RESTORIL) 30 MG capsule Take 30 mg by mouth at bedtime as needed for sleep.   Yes Historical Provider, MD  Thiamine HCl (VITAMIN B-1 PO) Take 1 tablet by mouth daily.   Yes Historical Provider, MD  diphenhydrAMINE (BENADRYL) 50 MG tablet Take 0.5 tablets (25 mg total) by mouth at bedtime as needed for itching. Take one tablet at bedtime to help sleep. 01/07/13   Alla GermanGregory Formanek, MD  hydrOXYzine (ATARAX/VISTARIL) 10 MG tablet Take 1 tablet (10 mg total) by mouth 2 (two) times daily as needed for itching. 09/08/13   Kerri PerchesMargaret E Simpson, MD   BP 156/56  Pulse 49  Temp(Src) 97.3 F (36.3 C) (Oral)  Resp 26  Ht 5' (1.524 m)  Wt 205 lb (92.987 kg)  BMI 40.04 kg/m2  SpO2 93% Physical Exam  Nursing note and vitals reviewed. Constitutional: She is oriented to person, place, and time. She appears well-developed and well-nourished. No distress.  Appears fatigue and mildly dyspneic.  HENT:  Head: Normocephalic and atraumatic.  Eyes: Conjunctivae and EOM are normal. Pupils are equal, round, and reactive to light.  Neck: Normal range of motion. Neck supple. JVD present. No tracheal deviation present.  Cardiovascular: Normal rate, regular rhythm and normal heart sounds.   No murmur heard. Pulmonary/Chest: Effort normal. No respiratory distress. She has no wheezes. She has rales.  Rales at right base  Abdominal: Soft. Bowel sounds are normal. She exhibits no distension and no mass. There is no tenderness.   Musculoskeletal: Normal range of motion. She exhibits edema.  1+ edema  Lymphadenopathy:    She has no cervical adenopathy.  Neurological: She is alert and oriented to person, place, and time. No cranial nerve deficit. Coordination normal.  Skin: Skin is warm and dry. No rash noted.  Psychiatric: She has a normal mood and affect. Her behavior is normal. Thought content normal.   ED Course  Procedures (including critical care time) DIAGNOSTIC STUDIES: Oxygen Saturation is 93% on RA, adequate by my interpretation.     COORDINATION OF CARE: 11:08 PM-Discussed treatment plan with pt at bedside and pt agreed to plan.   Results for orders placed during the hospital  encounter of 09/15/13  CBC WITH DIFFERENTIAL      Result Value Ref Range   WBC 12.2 (*) 4.0 - 10.5 K/uL   RBC 2.86 (*) 3.87 - 5.11 MIL/uL   Hemoglobin 7.3 (*) 12.0 - 15.0 g/dL   HCT 40.9 (*) 81.1 - 91.4 %   MCV 81.5  78.0 - 100.0 fL   MCH 25.5 (*) 26.0 - 34.0 pg   MCHC 31.3  30.0 - 36.0 g/dL   RDW 78.2 (*) 95.6 - 21.3 %   Platelets 244  150 - 400 K/uL   Neutrophils Relative % 86 (*) 43 - 77 %   Lymphocytes Relative 8 (*) 12 - 46 %   Monocytes Relative 5  3 - 12 %   Eosinophils Relative 1  0 - 5 %   Basophils Relative 0  0 - 1 %   Neutro Abs 10.5 (*) 1.7 - 7.7 K/uL   Lymphs Abs 1.0  0.7 - 4.0 K/uL   Monocytes Absolute 0.6  0.1 - 1.0 K/uL   Eosinophils Absolute 0.1  0.0 - 0.7 K/uL   Basophils Absolute 0.0  0.0 - 0.1 K/uL   RBC Morphology RARE NRBCs    TROPONIN I      Result Value Ref Range   Troponin I <0.30  <0.30 ng/mL  BASIC METABOLIC PANEL      Result Value Ref Range   Sodium 139  137 - 147 mEq/L   Potassium 6.8 (*) 3.7 - 5.3 mEq/L   Chloride 99  96 - 112 mEq/L   CO2 11 (*) 19 - 32 mEq/L   Glucose, Bld 120 (*) 70 - 99 mg/dL   BUN 086 (*) 6 - 23 mg/dL   Creatinine, Ser 57.84 (*) 0.50 - 1.10 mg/dL   Calcium 9.0  8.4 - 69.6 mg/dL   GFR calc non Af Amer 3 (*) >90 mL/min   GFR calc Af Amer 3 (*) >90 mL/min   POTASSIUM      Result Value Ref Range   Potassium 6.3 (*) 3.7 - 5.3 mEq/L   Dg Chest Portable 1 View  09/15/2013   CLINICAL DATA:  Chest pain.  Shortness of breath.  Hypertension.  EXAM: PORTABLE CHEST - 1 VIEW  COMPARISON:  09/25/2012  FINDINGS: Moderately enlarged cardiopericardial silhouette with indistinct pulmonary vasculature and mild perihilar airspace opacities. No blunting of the costophrenic angles.  Atherosclerotic aortic arch noted.  IMPRESSION: 1. Cardiomegaly and perihilar densities favoring Mild acute pulmonary edema. 2. Atherosclerotic aortic arch.   Electronically Signed   By: Herbie Baltimore M.D.   On: 09/15/2013 22:29   Images viewed by me.   EKG Interpretation   Date/Time:  Thursday September 15 2013 21:13:58 EDT Ventricular Rate:  50 PR Interval:    QRS Duration: 96 QT Interval:  496 QTC Calculation: 452 R Axis:   -55 Text Interpretation:  Junctional rhythm Left anterior fascicular block  Septal infarct , age undetermined Abnormal ECG Since last tracing  Bradycardia and loss of Sinus Rhythym Confirmed by Effie Shy  MD, Mechele Collin  ([id number]) on 09/15/2013 10:33:20 PM      CRITICAL CARE Performed by: UXLKG,MWNUU Total critical care time: 150 minutes Critical care time was exclusive of separately billable procedures and treating other patients. Critical care was necessary to treat or prevent imminent or life-threatening deterioration. Critical care was time spent personally by me on the following activities: development of treatment plan with patient and/or surrogate as well as nursing, discussions with consultants, evaluation of patient's  response to treatment, examination of patient, obtaining history from patient or surrogate, ordering and performing treatments and interventions, ordering and review of laboratory studies, ordering and review of radiographic studies, pulse oximetry and re-evaluation of patient's condition.  MDM   Final diagnoses:  CHF exacerbation   Hyperkalemia  End stage renal disease  Anemia of renal disease    Dyspnea which seems to be secondary to fluid overload. Chest x-ray is consistent with congestive heart failure. Initial laboratory workup shows severe hyperkalemia with potassium 6.8. She was given emergent treatment for hyperkalemia including calcium, sodium bicarbonate, glucose, insulin, Kayexalate, and albuterol via nebulizer. She was given a dose of furosemide. I did discuss with her the fact that standard treatment would be dialysis. She reiterated that she does not want dialysis. I did point out that there was significant chance of death and confirmed with her that she still did not want dialysis and that she was DNR/DNI. Old records were reviewed and creatinine has gone up from baseline of 9 to approximately 12 today. Following furosemide, she did have diuresis and she is feeling much better. Repeat potassium shows only a modest drop to 6.3. Case is discussed with Dr. Onalee Huaavid of [facility] who agrees to admit the patient. She's given a second dose of Kayexalate.    I personally performed the services described in this documentation, which was scribed in my presence. The recorded information has been reviewed and is accurate.      Dione Boozeavid Glick, MD 09/16/13 [id number]  Dione Boozeavid Glick, MD 09/16/13 [phone]

## 2013-09-15 NOTE — ED Notes (Signed)
CRITICAL VALUE ALERT  Critical value received: Pot 6.8 Date of notification:  09/15/13 Time of notification:  2223 hrs  Critical value read back:Yes  Nurse who received alert: YMW  MD notified (1st page): Joncarlos Atkison Time of first page:  2225 MD notified (2nd page):  Time of second page:  Responding MD:  Deretha EmoryZackowski  Time MD responded:  2225

## 2013-09-15 NOTE — ED Notes (Signed)
Pt with sudden CP and SOB since 1700 today, sharp in nature, takes ASA daily

## 2013-09-16 ENCOUNTER — Ambulatory Visit (HOSPITAL_COMMUNITY): Payer: Medicare Other

## 2013-09-16 ENCOUNTER — Telehealth: Payer: Self-pay

## 2013-09-16 ENCOUNTER — Encounter (HOSPITAL_COMMUNITY): Payer: Self-pay | Admitting: *Deleted

## 2013-09-16 ENCOUNTER — Other Ambulatory Visit (HOSPITAL_COMMUNITY): Payer: Medicare Other

## 2013-09-16 DIAGNOSIS — N189 Chronic kidney disease, unspecified: Secondary | ICD-10-CM

## 2013-09-16 DIAGNOSIS — E875 Hyperkalemia: Secondary | ICD-10-CM

## 2013-09-16 DIAGNOSIS — E8770 Fluid overload, unspecified: Secondary | ICD-10-CM | POA: Diagnosis present

## 2013-09-16 DIAGNOSIS — D631 Anemia in chronic kidney disease: Secondary | ICD-10-CM

## 2013-09-16 DIAGNOSIS — R0602 Shortness of breath: Secondary | ICD-10-CM | POA: Diagnosis present

## 2013-09-16 DIAGNOSIS — I509 Heart failure, unspecified: Secondary | ICD-10-CM

## 2013-09-16 DIAGNOSIS — E8779 Other fluid overload: Secondary | ICD-10-CM

## 2013-09-16 DIAGNOSIS — N039 Chronic nephritic syndrome with unspecified morphologic changes: Secondary | ICD-10-CM

## 2013-09-16 DIAGNOSIS — I10 Essential (primary) hypertension: Secondary | ICD-10-CM

## 2013-09-16 DIAGNOSIS — I5033 Acute on chronic diastolic (congestive) heart failure: Secondary | ICD-10-CM

## 2013-09-16 DIAGNOSIS — N186 End stage renal disease: Secondary | ICD-10-CM

## 2013-09-16 LAB — POTASSIUM
Potassium: 6.3 mEq/L — ABNORMAL HIGH (ref 3.7–5.3)
Potassium: 5.7 mEq/L — ABNORMAL HIGH (ref 3.7–5.3)

## 2013-09-16 MED ORDER — SODIUM CHLORIDE 0.9 % IJ SOLN
3.0000 mL | Freq: Two times a day (BID) | INTRAMUSCULAR | Status: DC
  Administered 2013-09-16: 3 mL via INTRAVENOUS

## 2013-09-16 MED ORDER — FLUTICASONE PROPIONATE 50 MCG/ACT NA SUSP
2.0000 | Freq: Every day | NASAL | Status: DC
  Administered 2013-09-16: 2 via NASAL
  Filled 2013-09-16: qty 16

## 2013-09-16 MED ORDER — ASPIRIN EC 81 MG PO TBEC
81.0000 mg | DELAYED_RELEASE_TABLET | Freq: Every day | ORAL | Status: DC
  Administered 2013-09-16: 81 mg via ORAL
  Filled 2013-09-16: qty 1

## 2013-09-16 MED ORDER — HYDRALAZINE HCL 25 MG PO TABS
50.0000 mg | ORAL_TABLET | Freq: Two times a day (BID) | ORAL | Status: DC
Start: 2013-09-16 — End: 2013-09-16
  Administered 2013-09-16: 50 mg via ORAL
  Filled 2013-09-16: qty 2

## 2013-09-16 MED ORDER — SODIUM POLYSTYRENE SULFONATE 15 GM/60ML PO SUSP
30.0000 g | Freq: Once | ORAL | Status: AC
  Administered 2013-09-16: 30 g via ORAL
  Filled 2013-09-16: qty 120

## 2013-09-16 MED ORDER — LEVOTHYROXINE SODIUM 75 MCG PO TABS
75.0000 ug | ORAL_TABLET | Freq: Every day | ORAL | Status: DC
  Administered 2013-09-16: 75 ug via ORAL
  Filled 2013-09-16: qty 1

## 2013-09-16 MED ORDER — ENOXAPARIN SODIUM 40 MG/0.4ML ~~LOC~~ SOLN: 40.0000 mg | SUBCUTANEOUS | Status: DC

## 2013-09-16 MED ORDER — SODIUM CHLORIDE 0.9 % IV SOLN
250.0000 mL | INTRAVENOUS | Status: DC | PRN
Start: 2013-09-16 — End: 2013-09-16

## 2013-09-16 MED ORDER — MORPHINE SULFATE 20 MG/5ML PO SOLN: 10.0000 mg | ORAL | Status: AC | PRN

## 2013-09-16 MED ORDER — BIOTENE DRY MOUTH MT LIQD
15.0000 mL | Freq: Two times a day (BID) | OROMUCOSAL | Status: DC
  Administered 2013-09-16: 15 mL via OROMUCOSAL

## 2013-09-16 MED ORDER — HYDROXYZINE HCL 10 MG PO TABS
10.0000 mg | ORAL_TABLET | Freq: Two times a day (BID) | ORAL | Status: DC | PRN
  Filled 2013-09-16: qty 1

## 2013-09-16 MED ORDER — HEPARIN SODIUM (PORCINE) 5000 UNIT/ML IJ SOLN
5000.0000 [IU] | Freq: Three times a day (TID) | INTRAMUSCULAR | Status: DC
  Administered 2013-09-16: 5000 [IU] via SUBCUTANEOUS
  Filled 2013-09-16: qty 1

## 2013-09-16 MED ORDER — LABETALOL HCL 200 MG PO TABS
200.0000 mg | ORAL_TABLET | Freq: Two times a day (BID) | ORAL | Status: DC
  Administered 2013-09-16: 200 mg via ORAL
  Filled 2013-09-16: qty 1

## 2013-09-16 MED ORDER — FOLIC ACID 1 MG PO TABS
1.0000 mg | ORAL_TABLET | Freq: Every day | ORAL | Status: DC
  Administered 2013-09-16: 1 mg via ORAL
  Filled 2013-09-16: qty 1

## 2013-09-16 MED ORDER — SODIUM CHLORIDE 0.9 % IJ SOLN: 3.0000 mL | INTRAMUSCULAR | Status: DC | PRN

## 2013-09-16 MED ORDER — TEMAZEPAM 15 MG PO CAPS: 30.0000 mg | ORAL_CAPSULE | Freq: Every evening | ORAL | Status: DC | PRN

## 2013-09-16 MED ORDER — LORAZEPAM 2 MG PO TABS: 2.0000 mg | ORAL_TABLET | Freq: Four times a day (QID) | ORAL | Status: AC | PRN

## 2013-09-16 MED ORDER — AMLODIPINE BESYLATE 5 MG PO TABS
10.0000 mg | ORAL_TABLET | Freq: Every day | ORAL | Status: DC
  Administered 2013-09-16: 10 mg via ORAL
  Filled 2013-09-16: qty 2

## 2013-09-16 MED ORDER — MONTELUKAST SODIUM 10 MG PO TABS: 10.0000 mg | ORAL_TABLET | Freq: Every day | ORAL | Status: DC

## 2013-09-16 MED ORDER — FUROSEMIDE 40 MG PO TABS: 120.0000 mg | ORAL_TABLET | Freq: Three times a day (TID) | ORAL | Status: AC

## 2013-09-16 MED ORDER — CALCIUM CARBONATE-VITAMIN D 500-200 MG-UNIT PO TABS
1.0000 | ORAL_TABLET | Freq: Three times a day (TID) | ORAL | Status: DC
  Administered 2013-09-16 (×2): 1 via ORAL
  Filled 2013-09-16 (×2): qty 1

## 2013-09-16 MED ORDER — HYDROCODONE-ACETAMINOPHEN 7.5-325 MG PO TABS: 1.0000 | ORAL_TABLET | Freq: Three times a day (TID) | ORAL | Status: DC | PRN

## 2013-09-16 MED ORDER — HYDROMORPHONE HCL PF 1 MG/ML IJ SOLN: 1.0000 mg | INTRAMUSCULAR | Status: DC | PRN

## 2013-09-16 MED ORDER — FUROSEMIDE 10 MG/ML IJ SOLN
80.0000 mg | Freq: Two times a day (BID) | INTRAMUSCULAR | Status: DC
  Administered 2013-09-16: 80 mg via INTRAVENOUS
  Filled 2013-09-16: qty 8

## 2013-09-16 NOTE — Telephone Encounter (Signed)
Pt referred to [facility] home[facility]. Rick from Hospice said family is requesting a nebulizer machine. She had a breathing treatment in the hospital and it seemed to help her. If this can be prescribed, send rx for neb machine and albuterol or albuterol/atrovent to [place]

## 2013-09-16 NOTE — Discharge Summary (Addendum)
PATIENT DETAILS Name: Terri Kelly Age: 78 y.o. Sex: female Date of Birth: 12/13/1935 MRN: [id number]. Admit Date: 09/15/2013 Admitting Physician: Haydee Monicaachal A David, MD JXB:JYNWGNFAPCP:Margaret Lodema HongSimpson, MD  Recommendations for Outpatient Follow-up:  End-stage renal disease-refuses a hemodialysis-being discharged home with hospice care. Please optimize comfort medications.  PRIMARY DISCHARGE DIAGNOSIS:  Principal Problem:   Fluid overload in the setting of end-stage renal disease Active Problems:   Acute hypoxic respiratory failure   HYPERTENSION   ESRD (end stage renal disease)- refused hemodialysis   Anemia in chronic kidney disease   Hyperkalemia   SOB (shortness of breath)   Severe metabolic acidosis      PAST MEDICAL HISTORY: Past Medical History  Diagnosis Date  . Hyperlipidemia   . Obesity   . Hypertension   . Diabetes mellitus type 1   . Back pain   . Sleep apnea 2009    non compliant with c-pap  . Diastolic dysfunction Nov 2011    [facility]   . Normocytic anemia 04/09/2012  . Allergy   . Arthritis   . Chronic kidney disease     does not want HD    DISCHARGE MEDICATIONS:   Medication List    STOP taking these medications       ASPIRIN LOW DOSE 81 MG EC tablet  Generic drug:  aspirin     calcium-vitamin D 500-200 MG-UNIT per tablet  Commonly known as:  OSCAL WITH D     clotrimazole-betamethasone cream  Commonly known as:  LOTRISONE     diphenhydrAMINE 50 MG tablet  Commonly known as:  BENADRYL     FLUoxetine 10 MG tablet  Commonly known as:  PROZAC     fluticasone 50 MCG/ACT nasal spray  Commonly known as:  FLONASE     folic acid 1 MG tablet  Commonly known as:  FOLVITE     HYDROcodone-acetaminophen 7.5-325 MG per tablet  Commonly known as:  NORCO     lovastatin 40 MG tablet  Commonly known as:  MEVACOR     montelukast 10 MG tablet  Commonly known as:  SINGULAIR     nystatin-triamcinolone ointment  Commonly known as:   MYCOLOG     omeprazole 20 MG capsule  Commonly known as:  PRILOSEC     temazepam 30 MG capsule  Commonly known as:  RESTORIL     VITAMIN B-1 PO      TAKE these medications       amLODipine 10 MG tablet  Commonly known as:  NORVASC  Take 1 tablet (10 mg total) by mouth daily.     furosemide 40 MG tablet  Commonly known as:  LASIX  Take 3 tablets (120 mg total) by mouth 3 (three) times daily.     hydrALAZINE 50 MG tablet  Commonly known as:  APRESOLINE  Take 50 mg by mouth 2 (two) times daily.     hydrOXYzine 10 MG tablet  Commonly known as:  ATARAX/VISTARIL  Take 1 tablet (10 mg total) by mouth 2 (two) times daily as needed for itching.     labetalol 200 MG tablet  Commonly known as:  NORMODYNE  Take 1 tablet (200 mg total) by mouth 2 (two) times daily.     levothyroxine 75 MCG tablet  Commonly known as:  SYNTHROID, LEVOTHROID  Take 1 tablet (75 mcg total) by mouth daily before breakfast.     LORazepam 2 MG tablet  Commonly known as:  ATIVAN  Take  1 tablet (2 mg total) by mouth every 6 (six) hours as needed for anxiety.     morphine 20 MG/5ML solution  Take 2.5-5 mLs (10-20 mg total) by mouth every 2 (two) hours as needed for pain (shortness of breath,sedation).        ALLERGIES:   Allergies  Allergen Reactions  . Ace Inhibitors     REACTION: cough  . Angiotensin Receptor Blockers     REACTION: cough  . Other Itching    IV Contrast Dye    BRIEF HPI:  See H&P, Labs, Consult and Test reports for all details in brief, patient is a 78 year old female with a known history of end-stage renal disease was refused hemodialysis in the past, presented to the hospital with shortness of breath, found to be severely anemic, hyperkalemic and with metabolic acidosis. Continues to refuse hemodialysis.  CONSULTATIONS:   None  PERTINENT RADIOLOGIC STUDIES: Dg Chest Portable 1 View  09/15/2013   CLINICAL DATA:  Chest pain.  Shortness of breath.  Hypertension.  EXAM:  PORTABLE CHEST - 1 VIEW  COMPARISON:  09/25/2012  FINDINGS: Moderately enlarged cardiopericardial silhouette with indistinct pulmonary vasculature and mild perihilar airspace opacities. No blunting of the costophrenic angles.  Atherosclerotic aortic arch noted.  IMPRESSION: 1. Cardiomegaly and perihilar densities favoring Mild acute pulmonary edema. 2. Atherosclerotic aortic arch.   Electronically Signed   By: Herbie Baltimore M.D.   On: 09/15/2013 22:29     PERTINENT LAB RESULTS: CBC:  Recent Labs  09/15/13 2140  WBC 12.2*  HGB 7.3*  HCT 23.3*  PLT 244   CMET CMP     Component Value Date/Time   NA 139 09/15/2013 2140   K 5.7* 09/16/2013 0502   CL 99 09/15/2013 2140   CO2 11* 09/15/2013 2140   GLUCOSE 120* 09/15/2013 2140   BUN 167* 09/15/2013 2140   CREATININE 12.11* 09/15/2013 2140   CREATININE 7.04* 03/19/2012 0821   CALCIUM 9.0 09/15/2013 2140   CALCIUM 8.7 11/20/2011 1254   PROT 8.0 08/26/2013 1400   ALBUMIN 4.0 08/26/2013 1400   AST 11 08/26/2013 1400   ALT 9 08/26/2013 1400   ALKPHOS 89 08/26/2013 1400   BILITOT 0.2* 08/26/2013 1400   GFRNONAA 3* 09/15/2013 2140   GFRNONAA 14* 09/02/2011 1623   GFRAA 3* 09/15/2013 2140   GFRAA 16* 09/02/2011 1623    GFR Estimated Creatinine Clearance: 3.8 ml/min (by C-G formula based on Cr of 12.11). No results found for this basename: LIPASE, AMYLASE,  in the last 72 hours  Recent Labs  09/15/13 2140  TROPONINI <0.30   No components found with this basename: POCBNP,  No results found for this basename: DDIMER,  in the last 72 hours No results found for this basename: HGBA1C,  in the last 72 hours No results found for this basename: CHOL, HDL, LDLCALC, TRIG, CHOLHDL, LDLDIRECT,  in the last 72 hours No results found for this basename: TSH, T4TOTAL, FREET3, T3FREE, THYROIDAB,  in the last 72 hours No results found for this basename: VITAMINB12, FOLATE, FERRITIN, TIBC, IRON, RETICCTPCT,  in the last 72 hours Coags: No results found for this  basename: PT, INR,  in the last 72 hours Microbiology: No results found for this or any previous visit (from the past 240 hour(s)).   BRIEF HOSPITAL COURSE:   Principal Problem:  Acute hypoxic respiratory failure/fluid overload from Acute Diastolic CHF- secondary to end-stage renal disease with hyperkalemia, metabolic acidosis and severe anemia  - Patient has a known  history of end-stage renal disease and has persistently refused dialysis in the past. She was admitted early this morning for shortness of breath, hyperkalemia, metabolic acidosis and anemia. This M.D., spoke with the patient, 2 daughters and a son at bedside and clearly explained that she could not further survive without dialysis, patient continued to refuse hemodialysis. She subsequently elected to go home with hospice care. Family and patient, are aware of very poor overall prognosis, and that without dialysis patient's life expectancy is expected to be a few days /few weeks. Family will provide patient with 24/7 care, case management will set up home hospice, patient will be placed on when necessary morphine and Ativan. She will be continued on her previous antihypertensive medications, she still makes some urine as though she will be placed on Lasix. These medications should be continued as long as possible. Family and patient, desire to go home with hospice rather than go to a residential hospice facility.  Hyperkalemia - Likely secondary to worsening renal function. She was treated with sodium bicarbonate, and an insulin, potassium on discharge was 5.7.  Anion gap metabolic acidosis  - Secondary to worsening renal failure.  - Supportive/hospice care.   Hypertension - Likely will worsen given worsening kidney function - Continue antihypertensives, Lasix-supportive/hospice care.  Anemia - Severe anemia likely secondary to worsening renal function. Transitioning to hospice care.  TODAY-DAY OF DISCHARGE:  Subjective:    Terri Kelly today  feels much more better than on admission. She continues to refuse dialysis, she is awake and alert, oriented x4. Her children are at bedside. She is requesting hospice on discharge.   Objective:   Blood pressure 161/61, pulse 76, temperature 99.2 F (37.3 C), temperature source Oral, resp. rate 24, height 4\' 9"  (1.448 m), weight 92.987 kg (205 lb), SpO2 93.00%. No intake or output data in the 24 hours ending 09/16/13 1155 Filed Weights   09/15/13 2149  Weight: 92.987 kg (205 lb)    Exam Awake Alert, Oriented *3, No new F.N deficits, Normal affect [place].AT,PERRAL Supple Neck,No JVD, No cervical lymphadenopathy appriciated.  Symmetrical Chest wall movement, Good air movement bilaterally,  bibasilar rales.  RRR,No Gallops,Rubs or new Murmurs +ve B.Sounds, Abd Soft, Non tender, No organomegaly appriciated, No rebound -guarding or rigidity. No Cyanosis, Clubbing or edema, No new Rash or bruise  DISCHARGE CONDITION: Stable  DISPOSITION: Home with Hospice  DISCHARGE INSTRUCTIONS:    Activity:  As tolerated with Full fall precautions use walker/cane & assistance as needed  Diet recommendation: Heart Healthy diet   Discharge Instructions   Call MD for:    Complete by:  As directed   If unable to provide comfort care at home     Diet - low sodium heart healthy    Complete by:  As directed      Increase activity slowly    Complete by:  As directed            Follow-up Information   Follow up with Syliva OvermanMargaret Simpson, MD. Schedule an appointment as soon as possible for a visit in 1 week.   Specialty:  Family Medicine   Contact information:   [address] [place] [address] [phone]       [facility]   Contact information:   [address] [place] [address] [phone]       Total Time spent on discharge equals 45 minutes.  SignedJeoffrey Massed: GHIMIRE,SHANKER 09/16/2013 11:55 AM  **Disclaimer: This  note may have been dictated with voice recognition software. Similar sounding words can inadvertently be transcribed and this note may contain transcription errors which may not have been corrected upon publication of note.**

## 2013-09-16 NOTE — Progress Notes (Signed)
Patient was seen and examined, H&P reviewed. I had a long discussion with the patient, and 2 daughters and a son at bedside. Patient has a known history of end-stage renal disease, and has consistently refused dialysis in the past. She was admitted early this morning with shortness of breath, and hyperkalemia. I have clearly explained to the patient and family, that for the patient to survive this admission, she will need to start dialysis. However patient adamantly refuses dialysis at this point, she is aware that she would likely pass in the next few days/weeks. Family is aware as well. Patient at this point, prefers to go home with hospice care. I will cancel nephrology consultation, I have asked case management to evaluate for home hospice arrangements. Family and patient aware of very poor prognosis without  dialysis, and realize that patient would likely die in the next few days/weeks without dialysis.

## 2013-09-16 NOTE — Discharge Planning (Signed)
Pt and family stated they were ready to go home and pain was under control.  Pt's IV and tele were removed and DC papers given.  Pt will be contacted and [facility] and phone number was given to family.  Also given scripts and told of any FU appointments that have been set up/suggested.  Pt will be wheeled to car when ready.

## 2013-09-16 NOTE — Care Management Note (Signed)
    Page 1 of 1   09/16/2013     12:16:26 PM CARE MANAGEMENT NOTE 09/16/2013  Patient:  Roddie McMILLNER,Miasia C   Account Number:  [id number]  Date Initiated:  09/16/2013  Documentation initiated by:  Rosemary HolmsOBSON,AMY  Subjective/Objective Assessment:   Pt admitted from home where she lives with her daughters. Elected to be DC'd to her home with Hospice services     Action/Plan:   Anticipated DC Date:  09/16/2013   Anticipated DC Plan:  HOME W HOSPICE CARE      DC Planning Services  CM consult      Choice offered to / List presented to:             [facility]  [facility]   Status of service:  Completed, signed off Medicare Important Message given?   (If response is "NO", the following Medicare IM given date fields will be blank) Date Medicare IM given:   Date Additional Medicare IM given:    Discharge Disposition:  HOME W HOSPICE CARE  Per UR Regulation:    If discussed at Long Length of Stay Meetings, dates discussed:    Comments:  09/16/13 Rosemary HolmsAmy Robson RN CM [facility]. Pt to DC via car and O2.

## 2013-09-16 NOTE — H&P (Signed)
PCP:   Terri OvermanMargaret Simpson, MD   Chief Complaint:  sob  HPI: 78 yo female with h/o esrd, dm, htn comes in with acute sob since 5am yesterday morning.  Pt has refused dialysis over the last year and still wishes to not do dialysis.  She still makes urine.  No cough.  No swelling.  No fevers.  No pain.  She feels better after getting lasix 160mg  iv .  Has had several bm after getting kayexelate also.  Review of Systems:  Positive and negative as per HPI otherwise all other systems are negative  Past Medical History: Past Medical History  Diagnosis Date  . Hyperlipidemia   . Obesity   . Hypertension   . Diabetes mellitus type 1   . Back pain   . Sleep apnea 2009    non compliant with c-pap  . Diastolic dysfunction Nov 2011    [facility]   . Normocytic anemia 04/09/2012  . Allergy   . Arthritis   . Chronic kidney disease     does not want HD   Past Surgical History  Procedure Laterality Date  . Vaginal hysterectomy  1978  . Myomectomy  1978  . Right knee arthroscopy  1976  . Right knee replacement      Medications: Prior to Admission medications   Medication Sig Start Date End Date Taking? Authorizing Provider  amLODipine (NORVASC) 10 MG tablet Take 1 tablet (10 mg total) by mouth daily. 03/21/13  Yes Kerri PerchesMargaret E Simpson, MD  aspirin (ASPIRIN LOW DOSE) 81 MG EC tablet Take 81 mg by mouth daily.     Yes Historical Provider, MD  calcium-vitamin D (OSCAL WITH D) 500-200 MG-UNIT per tablet Take 1 tablet by mouth 3 (three) times daily. 09/28/12  Yes Erick BlinksJehanzeb Memon, MD  clotrimazole-betamethasone (LOTRISONE) cream Apply 1 application topically 2 (two) times daily. 09/05/13  Yes Kerri PerchesMargaret E Simpson, MD  FLUoxetine (PROZAC) 10 MG tablet Take 1 tablet (10 mg total) by mouth daily. 08/17/13  Yes Kerri PerchesMargaret E Simpson, MD  fluticasone (FLONASE) 50 MCG/ACT nasal spray Place 2 sprays into both nostrils daily. 07/26/13  Yes Kerri PerchesMargaret E Simpson, MD  folic acid (FOLVITE) 1 MG tablet  Take 1 tablet (1 mg total) by mouth daily. 03/21/13  Yes Kerri PerchesMargaret E Simpson, MD  furosemide (LASIX) 80 MG tablet Take 1 tablet (80 mg total) by mouth 2 (two) times daily. 03/29/13  Yes Kerri PerchesMargaret E Simpson, MD  hydrALAZINE (APRESOLINE) 50 MG tablet Take 50 mg by mouth 2 (two) times daily.   Yes Historical Provider, MD  HYDROcodone-acetaminophen (NORCO) 7.5-325 MG per tablet Take 1 tablet by mouth 3 (three) times daily as needed for moderate pain. pain   Yes Historical Provider, MD  labetalol (NORMODYNE) 200 MG tablet Take 1 tablet (200 mg total) by mouth 2 (two) times daily. 03/21/13  Yes Kerri PerchesMargaret E Simpson, MD  levothyroxine (SYNTHROID, LEVOTHROID) 75 MCG tablet Take 1 tablet (75 mcg total) by mouth daily before breakfast. 08/17/13 03/17/14 Yes Kerri PerchesMargaret E Simpson, MD  lovastatin (MEVACOR) 40 MG tablet Take 2 tablets (80 mg total) by mouth at bedtime. 03/21/13  Yes Kerri PerchesMargaret E Simpson, MD  montelukast (SINGULAIR) 10 MG tablet Take 1 tablet (10 mg total) by mouth at bedtime. 07/26/13  Yes Kerri PerchesMargaret E Simpson, MD  nystatin-triamcinolone ointment [facility](MYCOLOG) Two times daily to affected area x 1 week then daily as needed 08/17/13  Yes Kerri PerchesMargaret E Simpson, MD  omeprazole (PRILOSEC) 20 MG capsule Take 1 capsule (  20 mg total) by mouth daily. 03/21/13 03/21/14 Yes Kerri PerchesMargaret E Simpson, MD  temazepam (RESTORIL) 30 MG capsule Take 30 mg by mouth at bedtime as needed for sleep.   Yes Historical Provider, MD  Thiamine HCl (VITAMIN B-1 PO) Take 1 tablet by mouth daily.   Yes Historical Provider, MD  diphenhydrAMINE (BENADRYL) 50 MG tablet Take 0.5 tablets (25 mg total) by mouth at bedtime as needed for itching. Take one tablet at bedtime to help sleep. 01/07/13   Alla GermanGregory Formanek, MD  hydrOXYzine (ATARAX/VISTARIL) 10 MG tablet Take 1 tablet (10 mg total) by mouth 2 (two) times daily as needed for itching. 09/08/13   Kerri PerchesMargaret E Simpson, MD    Allergies:   Allergies  Allergen Reactions  . Ace Inhibitors     REACTION: cough   . Angiotensin Receptor Blockers     REACTION: cough  . Other Itching    IV Contrast Dye    Social History:  reports that she has quit smoking. She has never used smokeless tobacco. She reports that she does not drink alcohol or use illicit drugs.  Family History: Family History  Problem Relation Age of Onset  . Heart disease Father   . Kidney disease Father   . Hypertension Brother     x1  . Hypertension Sister     x2  . Kidney failure Sister   . Kidney failure Brother   . Hypertension Brother     Physical Exam: Filed Vitals:   09/15/13 2352 09/16/13 0000 09/16/13 0030 09/16/13 0207  BP:  156/56 152/57 160/65  Pulse:  49 52 65  Temp:      TempSrc:      Resp:  26 17 20   Height:      Weight:      SpO2: 94% 93% 92% 95%   General appearance: alert, cooperative and mild distress Head: Normocephalic, without obvious abnormality, atraumatic Eyes: negative Nose: Nares normal. Septum midline. Mucosa normal. No drainage or sinus tenderness. Neck: supple, symmetrical, trachea midline Lungs: clear to auscultation bilaterally Heart: regular rate and rhythm, S1, S2 normal, no murmur, click, rub or gallop Abdomen: soft, non-tender; bowel sounds normal; no masses,  no organomegaly Extremities: extremities normal, atraumatic, no cyanosis or edema Pulses: 2+ and symmetric Skin: Skin color, texture, turgor normal. No rashes or lesions Neurologic: Grossly normal    Labs on Admission:   Recent Labs  09/15/13 2140 09/16/13 0126  NA 139  --   K 6.8* 6.3*  CL 99  --   CO2 11*  --   GLUCOSE 120*  --   BUN 167*  --   CREATININE 12.11*  --   CALCIUM 9.0  --     Recent Labs  09/15/13 2140  WBC 12.2*  NEUTROABS 10.5*  HGB 7.3*  HCT 23.3*  MCV 81.5  PLT 244    Recent Labs  09/15/13 2140  TROPONINI <0.30   Radiological Exams on Admission: Dg Chest Portable 1 View  09/15/2013   CLINICAL DATA:  Chest pain.  Shortness of breath.  Hypertension.  EXAM: PORTABLE CHEST  - 1 VIEW  COMPARISON:  09/25/2012  FINDINGS: Moderately enlarged cardiopericardial silhouette with indistinct pulmonary vasculature and mild perihilar airspace opacities. No blunting of the costophrenic angles.  Atherosclerotic aortic arch noted.  IMPRESSION: 1. Cardiomegaly and perihilar densities favoring Mild acute pulmonary edema. 2. Atherosclerotic aortic arch.   Electronically Signed   By: Herbie BaltimoreWalt  Liebkemann M.D.   On: 09/15/2013 22:29    Assessment/Plan  78 yo female with esrd, fluid overload from progressive worsening renal function, hyperkalemia  Principal Problem:   Fluid overload/advanced ESRD-  Have discussed again with pt dialysis, she does not wish to pursue this, and doesn't really want to talk about it.  She says she has seen too many of her family members suffer while on dialysis.  Will give lasix 80mg  iv q 12.  Repeat k level now.  Nephrology consult to help optimize medical management as much as possible.  Active Problems:   HYPERTENSION   ESRD (end stage renal disease)   Anemia in chronic kidney disease   Hyperkalemia   SOB (shortness of breath)  Pt also refuses hospice, appears to be competent.  Several physicians have counseled her on her options, and she has consistently not wanted dialysis.  She has 2 daughters who are CNAs who are taking care of her at home.  Pt denies any discomfort.  She is DNR.  DAVID,RACHAL A 09/16/2013, 3:18 AM

## 2013-09-16 NOTE — Discharge Planning (Addendum)
Pt SPO2 documented as 87% on RA.  On 2L was able to climb to 93%.  Pt will need O2 at DC for home. Pt will be DC on O2 - [facility]

## 2013-09-17 NOTE — Telephone Encounter (Signed)
Contacted after hours and verbal OK provided to hospice nurse to send in meds

## 2013-09-22 ENCOUNTER — Other Ambulatory Visit: Payer: Self-pay | Admitting: Family Medicine

## 2013-09-24 DIAGNOSIS — R05 Cough: Secondary | ICD-10-CM | POA: Insufficient documentation

## 2013-09-24 DIAGNOSIS — R058 Other specified cough: Secondary | ICD-10-CM | POA: Insufficient documentation

## 2013-09-24 NOTE — Assessment & Plan Note (Signed)
Increased cough esp in supine position due to uncontrolled allergies. Improved allergy control, and cough suppressant as needed at bedtime

## 2013-09-24 NOTE — Progress Notes (Signed)
   Subjective:    Patient ID: Terri Kelly, female    DOB: 09/27/1935, 78 y.o.   MRN: [id number]  HPI 1 week h/o increased and uncontrolled allergy symptoms, excessive nasal drainage which is clear, watery eyes and excessive cough esp in the supine position at night. Denies fever or chills. Increased knee pain and stiffness, has fallen several times in the past year Increased depression and fatigue, overwhelmed with loss of spouse and issues in her children's lives   Review of Systems See HPI t. Denies chest congestion, productive cough or wheezing. Denies chest pains, palpitations and leg swelling Denies abdominal pain, nausea, vomiting,diarrhea or constipation.   Denies dysuria, frequency, hesitancy or incontinence.  Denies headaches, seizures, numbness, or tingling. c/o rash under breasts, no purulent drainage        Objective:   Physical Exam BP 172/84  Pulse 60  Temp(Src) 97.9 F (36.6 C)  Resp 18  Ht 5' (1.524 m)  SpO2 98% Patient alert and oriented and in no cardiopulmonary distress.Pt in pain  HEENT: No facial asymmetry, EOMI,   oropharynx pink and moist.  Neck decreased ROM no JVD, no mass.Nasal mucosa erythematous and edematous,fronatal and maxillary sinuses tender, erythema and excessive watering of both eyes  Chest: Clear to auscultation bilaterally.  CVS: S1, S2 no murmurs, no S3.  ABD: Soft non tender.   Ext: No edema  WU:[id number]:decreaased  ROM spine, shoulders, hips and knees.Left kinee significant crepitus , warmth and tenderness  Skin: Intact, no ulcerations or rash noted.  Psych: Good eye contact, flat and mildly tearful affect. Memory intact depressed appearing.  CNS: CN 2-12 intact, power,  normal throughout.no focal deficits noted.        Assessment & Plan:  HYPERTENSION Uncontrolled. Pt has ESRD and has refused dialysis for over 2 years, no med change Encouraged to reduce sodium intake and take medication on schedule  Allergic  rhinitis Uncontrolled, depo medrol in office followed by short course of prednisone  HYPERLIPIDEMIA Controlled, no change in medication.  ESRD (end stage renal disease) Upcoming appt with nephrology, sti;ll no interest in dialysis  Depression Increased symptoms of depression following loss of spouse last year, and multiple issues with her children. Trial of antidepressant also encouraged pt to speak with therapist, however, no interest at this time , states she will speak with her pastor and that she has the support of her children who accompany her  Knee pain Uncontrolled, depo mefdrol in office   Nocturnal cough Increased cough esp in supine position due to uncontrolled allergies. Improved allergy control, and cough suppressant as needed at bedtime

## 2013-09-24 NOTE — Assessment & Plan Note (Signed)
Upcoming appt with nephrology, sti;ll no interest in dialysis

## 2013-09-24 NOTE — Assessment & Plan Note (Signed)
Uncontrolled, depo mefdrol in office

## 2013-09-24 NOTE — Assessment & Plan Note (Signed)
Controlled, no change in medication  

## 2013-09-24 NOTE — Assessment & Plan Note (Signed)
Uncontrolled , depo medrol in office followed by short course of prednisone °

## 2013-09-24 NOTE — Assessment & Plan Note (Signed)
Uncontrolled. Pt has ESRD and has refused dialysis for over 2 years, no med change Encouraged to reduce sodium intake and take medication on schedule

## 2013-09-24 NOTE — Assessment & Plan Note (Signed)
Increased symptoms of depression following loss of spouse last year, and multiple issues with her children. Trial of antidepressant also encouraged pt to speak with therapist, however, no interest at this time , states she will speak with her pastor and that she has the support of her children who accompany her

## 2013-10-07 ENCOUNTER — Other Ambulatory Visit (HOSPITAL_COMMUNITY): Payer: Medicare Other

## 2013-10-07 ENCOUNTER — Ambulatory Visit (HOSPITAL_COMMUNITY): Payer: Medicare Other

## 2013-10-12 ENCOUNTER — Ambulatory Visit: Payer: Medicare Other | Admitting: Family Medicine

## 2013-10-22 DEATH — deceased

## 2013-12-05 ENCOUNTER — Encounter: Payer: Medicare Other | Admitting: Family Medicine

## 2015-01-03 IMAGING — CR DG CHEST 1V PORT
1 series · 1 of 1 positions shown · non-contrast
Comparison: 02/04/2010

CLINICAL DATA: Shortness of breath, wheezing

PORTABLE CHEST - 1 VIEW

[portable]
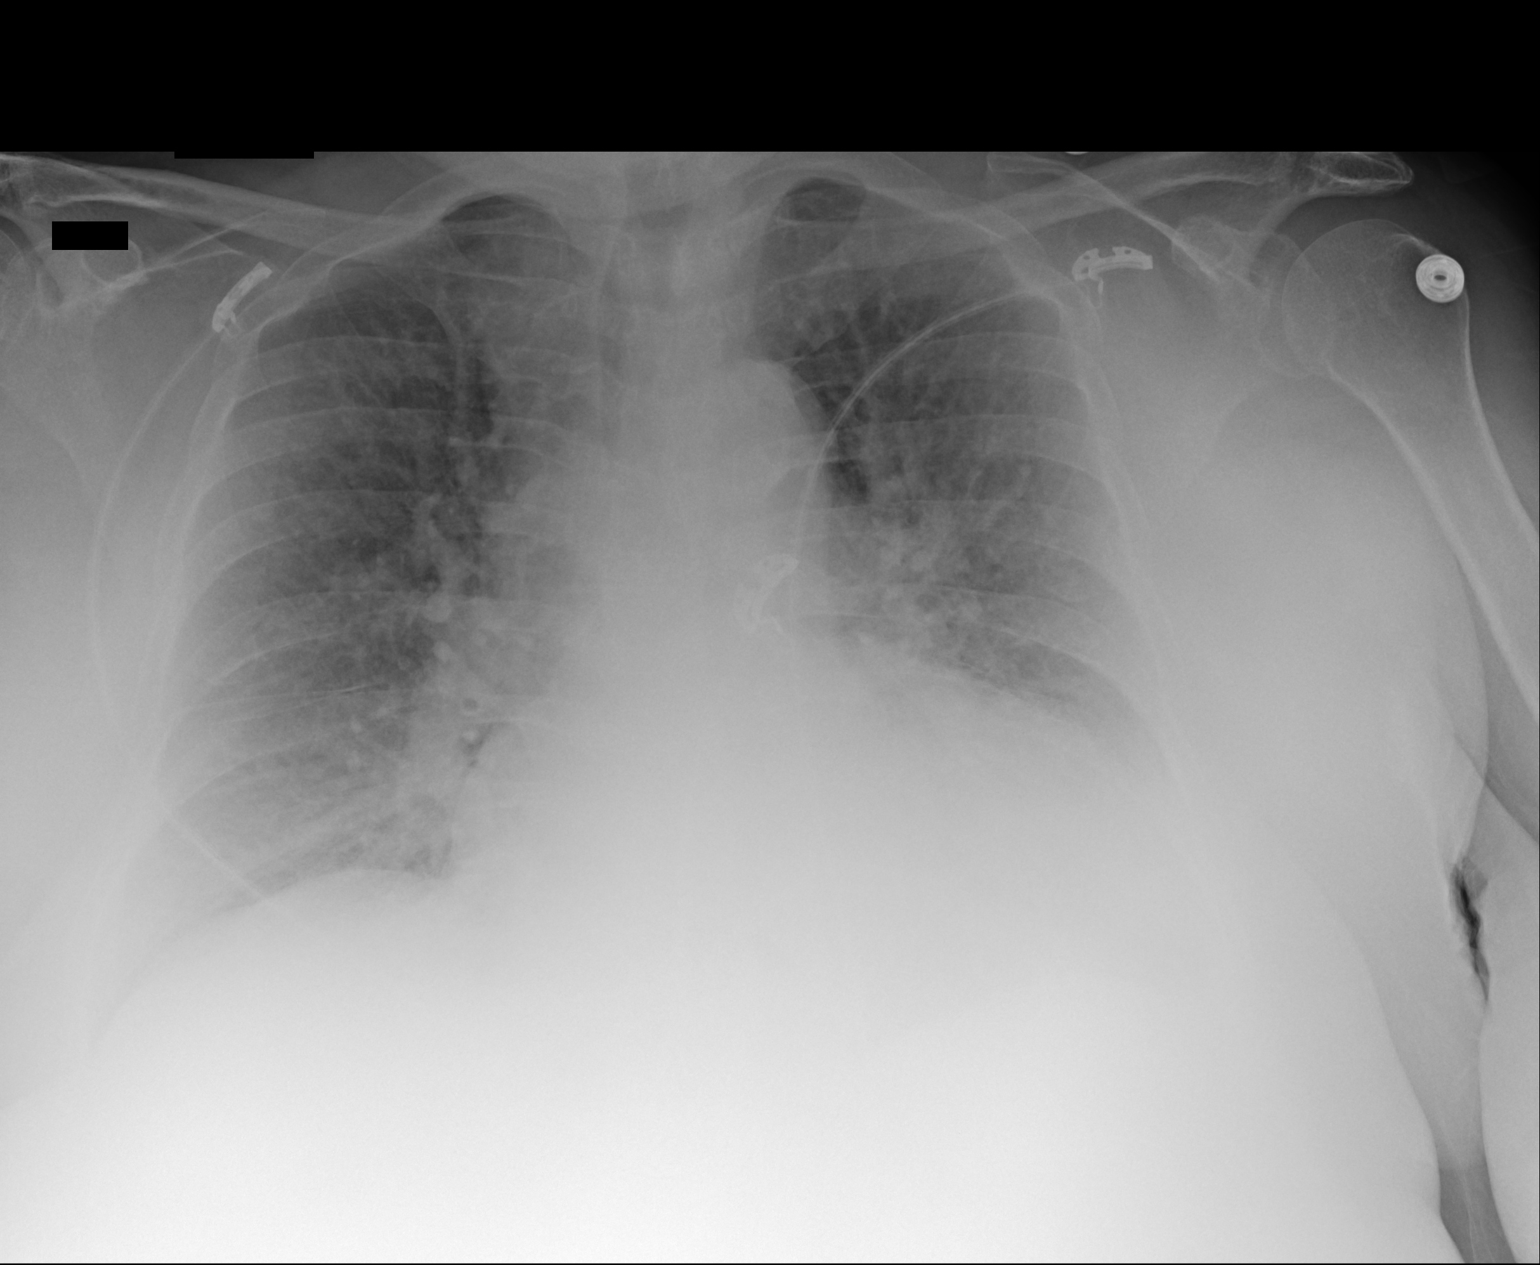

[1 of 1 positions shown; findings below may reference images not displayed]

FINDINGS: Cardiomegaly with suspected mild interstitial edema.

Left lung base is poorly visualized.  Underlying atelectasis and/or
effusion is difficult to exclude.

No pneumothorax.
IMPRESSION: Cardiomegaly with suspected mild interstitial edema.

## 2015-12-24 IMAGING — CR DG CHEST 1V PORT
1 series · 1 of 1 positions shown · non-contrast
Comparison: 09/25/2012

CLINICAL DATA: Chest pain.  Shortness of breath.  Hypertension.

EXAM:
PORTABLE CHEST - 1 VIEW

[portable]
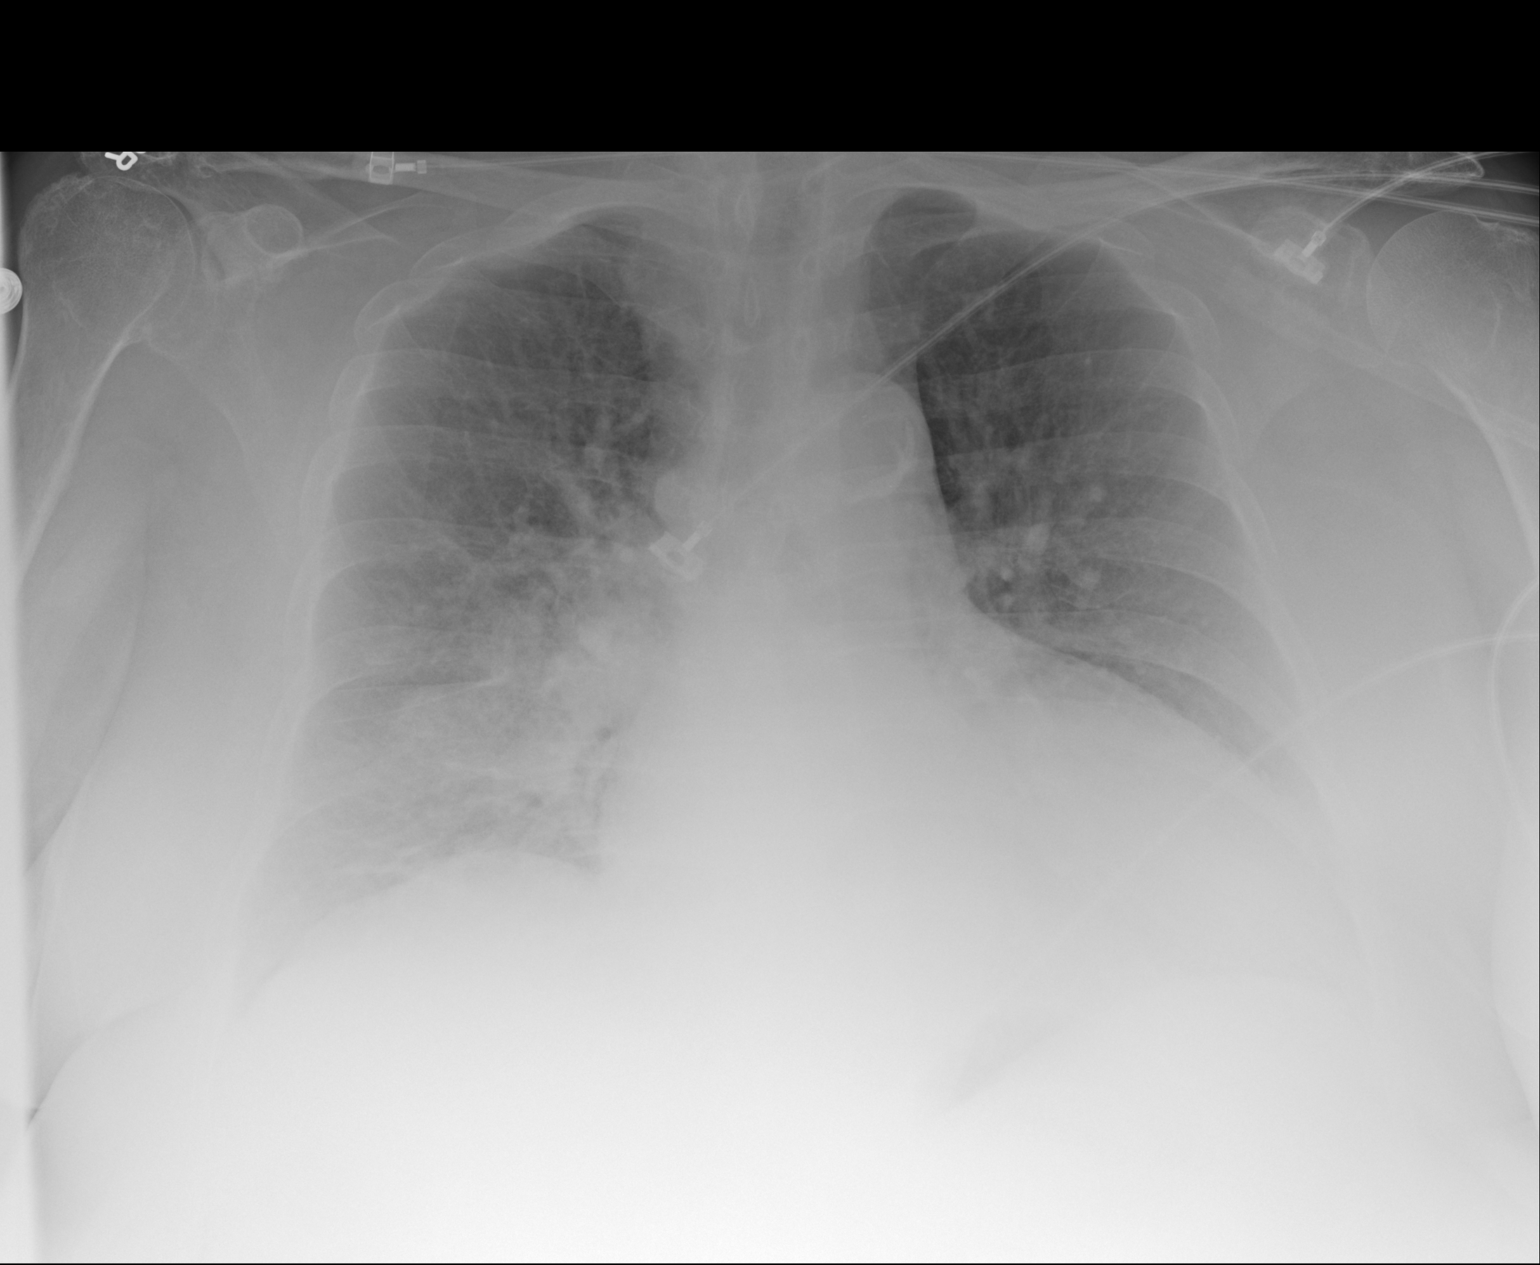

[1 of 1 positions shown; findings below may reference images not displayed]

FINDINGS: Moderately enlarged cardiopericardial silhouette with indistinct
pulmonary vasculature and mild perihilar airspace opacities. No
blunting of the costophrenic angles.

Atherosclerotic aortic arch noted.
IMPRESSION: 1. Cardiomegaly and perihilar densities favoring Mild acute
pulmonary edema.
2. Atherosclerotic aortic arch.

## 2017-01-21 ENCOUNTER — Other Ambulatory Visit: Payer: Self-pay | Admitting: Nurse Practitioner
